# Patient Record
Sex: Female | Born: 1955
Health system: Southern US, Community
[De-identification: ages and names within clinical notes are randomized; demographics above are authoritative.]

## PROBLEM LIST (undated history)

## (undated) DIAGNOSIS — R112 Nausea with vomiting, unspecified: Secondary | ICD-10-CM

## (undated) DIAGNOSIS — I4891 Unspecified atrial fibrillation: Secondary | ICD-10-CM

## (undated) DIAGNOSIS — Z87898 Personal history of other specified conditions: Secondary | ICD-10-CM

## (undated) DIAGNOSIS — IMO0002 Reserved for concepts with insufficient information to code with codable children: Secondary | ICD-10-CM

## (undated) DIAGNOSIS — N200 Calculus of kidney: Secondary | ICD-10-CM

## (undated) DIAGNOSIS — M1990 Unspecified osteoarthritis, unspecified site: Secondary | ICD-10-CM

## (undated) DIAGNOSIS — T7840XA Allergy, unspecified, initial encounter: Secondary | ICD-10-CM

## (undated) DIAGNOSIS — Z9889 Other specified postprocedural states: Secondary | ICD-10-CM

## (undated) DIAGNOSIS — K529 Noninfective gastroenteritis and colitis, unspecified: Secondary | ICD-10-CM

## (undated) DIAGNOSIS — R42 Dizziness and giddiness: Secondary | ICD-10-CM

## (undated) HISTORY — PX: SHOULDER SURGERY: SHX246

## (undated) HISTORY — DX: Personal history of other specified conditions: Z87.898

## (undated) HISTORY — PX: COLONOSCOPY: SHX174

## (undated) HISTORY — DX: Unspecified osteoarthritis, unspecified site: M19.90

## (undated) HISTORY — DX: Nausea with vomiting, unspecified: R11.2

## (undated) HISTORY — DX: Dizziness and giddiness: R42

## (undated) HISTORY — DX: Calculus of kidney: N20.0

## (undated) HISTORY — DX: Other specified postprocedural states: Z98.890

## (undated) HISTORY — DX: Unspecified atrial fibrillation: I48.91

## (undated) HISTORY — DX: Noninfective gastroenteritis and colitis, unspecified: K52.9

## (undated) HISTORY — DX: Allergy, unspecified, initial encounter: T78.40XA

## (undated) HISTORY — DX: Reserved for concepts with insufficient information to code with codable children: IMO0002

---

## 1980-03-14 HISTORY — PX: GYNECOLOGIC CRYOSURGERY: SHX857

## 1989-03-14 HISTORY — PX: VAGINAL HYSTERECTOMY: SUR661

## 1999-12-24 ENCOUNTER — Encounter (INDEPENDENT_AMBULATORY_CARE_PROVIDER_SITE_OTHER): Payer: Self-pay

## 1999-12-24 ENCOUNTER — Other Ambulatory Visit: Admission: RE | Admit: 1999-12-24 | Discharge: 1999-12-24 | Payer: Self-pay | Admitting: Obstetrics and Gynecology

## 2002-01-01 ENCOUNTER — Other Ambulatory Visit: Admission: RE | Admit: 2002-01-01 | Discharge: 2002-01-01 | Payer: Self-pay | Admitting: Obstetrics and Gynecology

## 2002-03-14 HISTORY — PX: BACK SURGERY: SHX140

## 2002-04-24 ENCOUNTER — Observation Stay (HOSPITAL_COMMUNITY): Admission: RE | Admit: 2002-04-24 | Discharge: 2002-04-25 | Payer: Self-pay | Admitting: Specialist

## 2002-04-24 ENCOUNTER — Encounter: Payer: Self-pay | Admitting: Specialist

## 2002-04-25 ENCOUNTER — Encounter (INDEPENDENT_AMBULATORY_CARE_PROVIDER_SITE_OTHER): Payer: Self-pay | Admitting: Specialist

## 2003-02-10 ENCOUNTER — Other Ambulatory Visit: Admission: RE | Admit: 2003-02-10 | Discharge: 2003-02-10 | Payer: Self-pay | Admitting: Obstetrics and Gynecology

## 2003-08-13 ENCOUNTER — Emergency Department (HOSPITAL_COMMUNITY): Admission: EM | Admit: 2003-08-13 | Discharge: 2003-08-13 | Payer: Self-pay | Admitting: Emergency Medicine

## 2004-02-18 ENCOUNTER — Other Ambulatory Visit: Admission: RE | Admit: 2004-02-18 | Discharge: 2004-02-18 | Payer: Self-pay | Admitting: Obstetrics and Gynecology

## 2004-05-24 ENCOUNTER — Ambulatory Visit: Payer: Self-pay | Admitting: Pulmonary Disease

## 2005-02-18 ENCOUNTER — Other Ambulatory Visit: Admission: RE | Admit: 2005-02-18 | Discharge: 2005-02-18 | Payer: Self-pay | Admitting: Obstetrics and Gynecology

## 2006-02-21 ENCOUNTER — Other Ambulatory Visit: Admission: RE | Admit: 2006-02-21 | Discharge: 2006-02-21 | Payer: Self-pay | Admitting: Obstetrics and Gynecology

## 2006-08-18 ENCOUNTER — Ambulatory Visit: Payer: Self-pay | Admitting: Pulmonary Disease

## 2006-08-18 LAB — CONVERTED CEMR LAB
ALT: 22 units/L (ref 0–40)
AST: 21 units/L (ref 0–37)
Basophils Relative: 1 % (ref 0.0–1.0)
Bilirubin, Direct: 0.1 mg/dL (ref 0.0–0.3)
CO2: 30 meq/L (ref 19–32)
Calcium: 9.8 mg/dL (ref 8.4–10.5)
Chloride: 101 meq/L (ref 96–112)
Creatinine, Ser: 0.7 mg/dL (ref 0.4–1.2)
Eosinophils Absolute: 0.1 10*3/uL (ref 0.0–0.6)
Eosinophils Relative: 1.5 % (ref 0.0–5.0)
GFR calc non Af Amer: 94 mL/min
Glucose, Bld: 96 mg/dL (ref 70–99)
Platelets: 242 10*3/uL (ref 150–400)
RBC: 4.3 M/uL (ref 3.87–5.11)
RDW: 12.1 % (ref 11.5–14.6)
Total Bilirubin: 0.8 mg/dL (ref 0.3–1.2)
WBC: 5.1 10*3/uL (ref 4.5–10.5)

## 2006-08-21 ENCOUNTER — Ambulatory Visit: Payer: Self-pay | Admitting: Internal Medicine

## 2006-08-24 ENCOUNTER — Ambulatory Visit: Payer: Self-pay | Admitting: Gastroenterology

## 2006-08-25 ENCOUNTER — Ambulatory Visit: Payer: Self-pay | Admitting: Gastroenterology

## 2006-08-26 ENCOUNTER — Emergency Department (HOSPITAL_COMMUNITY): Admission: EM | Admit: 2006-08-26 | Discharge: 2006-08-27 | Payer: Self-pay | Admitting: Emergency Medicine

## 2007-02-19 ENCOUNTER — Telehealth: Payer: Self-pay | Admitting: Pulmonary Disease

## 2007-03-30 ENCOUNTER — Other Ambulatory Visit: Admission: RE | Admit: 2007-03-30 | Discharge: 2007-03-30 | Payer: Self-pay | Admitting: Obstetrics and Gynecology

## 2007-05-01 ENCOUNTER — Ambulatory Visit: Payer: Self-pay | Admitting: Pulmonary Disease

## 2007-05-01 DIAGNOSIS — Z87442 Personal history of urinary calculi: Secondary | ICD-10-CM

## 2007-05-01 DIAGNOSIS — R519 Headache, unspecified: Secondary | ICD-10-CM | POA: Insufficient documentation

## 2007-05-01 DIAGNOSIS — R51 Headache: Secondary | ICD-10-CM

## 2007-05-01 DIAGNOSIS — K5289 Other specified noninfective gastroenteritis and colitis: Secondary | ICD-10-CM

## 2007-05-01 DIAGNOSIS — IMO0002 Reserved for concepts with insufficient information to code with codable children: Secondary | ICD-10-CM

## 2007-05-01 DIAGNOSIS — R42 Dizziness and giddiness: Secondary | ICD-10-CM | POA: Insufficient documentation

## 2007-05-01 HISTORY — DX: Personal history of urinary calculi: Z87.442

## 2007-07-05 ENCOUNTER — Encounter: Payer: Self-pay | Admitting: Pulmonary Disease

## 2007-07-28 DIAGNOSIS — K589 Irritable bowel syndrome without diarrhea: Secondary | ICD-10-CM | POA: Insufficient documentation

## 2007-07-28 DIAGNOSIS — Z8719 Personal history of other diseases of the digestive system: Secondary | ICD-10-CM

## 2008-04-03 ENCOUNTER — Ambulatory Visit: Payer: Self-pay | Admitting: Obstetrics and Gynecology

## 2008-04-03 ENCOUNTER — Encounter: Payer: Self-pay | Admitting: Obstetrics and Gynecology

## 2008-04-03 ENCOUNTER — Other Ambulatory Visit: Admission: RE | Admit: 2008-04-03 | Discharge: 2008-04-03 | Payer: Self-pay | Admitting: Obstetrics and Gynecology

## 2009-01-26 ENCOUNTER — Encounter: Payer: Self-pay | Admitting: Pulmonary Disease

## 2009-04-06 ENCOUNTER — Other Ambulatory Visit: Admission: RE | Admit: 2009-04-06 | Discharge: 2009-04-06 | Payer: Self-pay | Admitting: Obstetrics and Gynecology

## 2009-04-06 ENCOUNTER — Ambulatory Visit: Payer: Self-pay | Admitting: Obstetrics and Gynecology

## 2009-04-09 ENCOUNTER — Encounter: Admission: RE | Admit: 2009-04-09 | Discharge: 2009-04-09 | Payer: Self-pay | Admitting: Specialist

## 2009-04-10 ENCOUNTER — Ambulatory Visit (HOSPITAL_BASED_OUTPATIENT_CLINIC_OR_DEPARTMENT_OTHER): Admission: RE | Admit: 2009-04-10 | Discharge: 2009-04-10 | Payer: Self-pay | Admitting: Specialist

## 2009-06-17 ENCOUNTER — Ambulatory Visit (HOSPITAL_BASED_OUTPATIENT_CLINIC_OR_DEPARTMENT_OTHER): Admission: RE | Admit: 2009-06-17 | Discharge: 2009-06-17 | Payer: Self-pay | Admitting: Specialist

## 2009-09-16 ENCOUNTER — Ambulatory Visit: Payer: Self-pay | Admitting: Obstetrics and Gynecology

## 2010-04-08 ENCOUNTER — Other Ambulatory Visit: Payer: Self-pay | Admitting: Obstetrics and Gynecology

## 2010-04-08 ENCOUNTER — Ambulatory Visit
Admission: RE | Admit: 2010-04-08 | Discharge: 2010-04-08 | Payer: Self-pay | Source: Home / Self Care | Attending: Obstetrics and Gynecology | Admitting: Obstetrics and Gynecology

## 2010-04-08 ENCOUNTER — Other Ambulatory Visit (HOSPITAL_COMMUNITY)
Admission: RE | Admit: 2010-04-08 | Discharge: 2010-04-08 | Disposition: A | Discharge status: Home / Self Care | Payer: Self-pay | Source: Ambulatory Visit | Attending: Obstetrics and Gynecology | Admitting: Obstetrics and Gynecology

## 2010-04-08 DIAGNOSIS — Z124 Encounter for screening for malignant neoplasm of cervix: Secondary | ICD-10-CM | POA: Insufficient documentation

## 2010-05-30 LAB — CBC
HCT: 37.9 % (ref 36.0–46.0)
Hemoglobin: 12.6 g/dL (ref 12.0–15.0)
MCHC: 33.1 g/dL (ref 30.0–36.0)
RDW: 12.8 % (ref 11.5–15.5)

## 2010-05-30 LAB — BASIC METABOLIC PANEL
CO2: 29 mEq/L (ref 19–32)
Calcium: 8.9 mg/dL (ref 8.4–10.5)
Glucose, Bld: 100 mg/dL — ABNORMAL HIGH (ref 70–99)
Sodium: 140 mEq/L (ref 135–145)

## 2010-06-02 LAB — BASIC METABOLIC PANEL
BUN: 16 mg/dL (ref 6–23)
Calcium: 8.8 mg/dL (ref 8.4–10.5)
GFR calc non Af Amer: >60 mL/min (ref >60)
Glucose, Bld: 92 mg/dL (ref 70–99)
Sodium: 137 mEq/L (ref 135–145)

## 2010-06-02 LAB — CBC
Platelets: 247 10*3/uL (ref 150–400)
RDW: 12.4 % (ref 11.5–15.5)

## 2010-07-27 NOTE — Assessment & Plan Note (Signed)
HEALTHCARE                         GASTROENTEROLOGY OFFICE NOTE   NAME:Shirley, Carroll                       MRN:          [id number]  DATE:08/24/2006                            DOB:          1955/07/26    Shirley Carroll is a 55 year old white female referred by Dr. Kriste Basque for  evaluation of abdominal pain.   Shirley Carroll went on a cruise on May 17th, and had no problems.  Exactly 1  week ago she developed dull aching discomfort in her suprapubic area,  which has advanced around her flanks in to her back.  This is dull  steady pain that is worse as the day goes on, and is associated with  some diarrhea.  She has had 2 CT scans of the abdomen, which have been  unremarkable, with and without contrast.  Lab data has been normal with  a sed rate of 16, and a normal CBC and metabolic profile.   She denies any other infectious disease exposure.  There are no sick  family members at home.  She does use Aleve on a p.r.n. basis, but does  not use heavy doses.  She does not smoke or abuse ethanol.  She has had  no associated fever, chills, skin rashes, joint pains, or other systemic  complaints.  Because of the extent of her pain, she has trouble sleeping  at night.  She has never had previous abdominal or pelvic surgery.  As  part of her workup, she saw Dr. Darvin Neighbours, and apparently had a negative  cystoscopy.  She denies gynecologic problems.   Patient does have a vague past history of colitis when she saw Dr.  Juanda Chance some 22 years ago.  These records are not available for review.  Patient does have 3 to 4 loose bowel movements a day, but denies rectal  bleeding or nocturnal diarrhea.   PAST MEDICAL HISTORY:  Otherwise, noncontributory, except for recurrent  kidney stones.  She does have a herniated disk, and had back surgery in  2003, and a hysterectomy in 1991.   FAMILY HISTORY:  Noncontributory in terms of gastrointestinal problems.  She has 3 aunts that have  had breast cancer.   SOCIAL HISTORY:  She is an Manufacturing engineer, and has a high school  education.  She is married and lives with her husband.  She does not  abuse alcohol, cigarettes, or illicit drugs.   MEDICATIONS:  1. Anaspaz 125 mcg 4 times a day.  2. Cipro, which was prescribed, but she has not taken.   SHE DOES HAVE A HISTORY OF ALLERGIC REACTION TO DARVOCET.   EXAMINATION:  Shows her to be a healthy, attractive appearing white  female in no distress, appearing her stated age.  I cannot appreciate stigmata of chronic liver disease.  She weighs 163 pounds.  Blood pressure is 98/68.  Pulse was 64 and  regular.  CHEST:  Was clear.  She appeared to be in a regular rhythm without murmurs, gallops, or  rubs.  I could not appreciate any abdominal distention, organomegaly, or  significant tenderness to palpation.  Peripheral extremities  were unremarkable.  Inspection of the rectum was unremarkable.  Rectal exam showed loose  stool, which was trace guaiac positive.   ASSESSMENT:  Shirley Carroll gives a history of previous colitis.  I would  wonder if currently if she does have infectious colitis versus  inflammatory bowel disease.   RECOMMENDATIONS:  I have asked her to bring in a stool culture for exam  as soon as possible.  We will go ahead and proceed with colonoscopy  within the next 24 hours.  I have given her some Tylenol 3 to use along  with her Anaspaz in the interim.     Vania Rea. Jarold Motto, MD, Caleen Essex, FAGA  Electronically Signed    DRP/MedQ  DD: 08/24/2006  DT: 08/24/2006  Job #: [id number]   cc:   Lonzo Cloud. Kriste Basque, MD  Rande Brunt Eda Paschal, M.D.  Ronald L. Earlene Plater, M.D.

## 2010-07-30 NOTE — H&P (Signed)
NAMEMarland Kitchen  Shirley Carroll, Shirley Carroll NO.:  [id number]   MEDICAL RECORD NO.:  [id number]                    PATIENT TYPE:   LOCATION:                                       FACILITY:   PHYSICIAN:  Jene Every, M.D.                 DATE OF BIRTH:  June 24, 1955   DATE OF ADMISSION:  04/24/2002  DATE OF DISCHARGE:                                HISTORY & PHYSICAL   CHIEF COMPLAINT:  Right lower extremity pain.   HISTORY OF PRESENT ILLNESS:  The patient is a 55 year old female who first  noted low back pain in September 2003, while lifting tree stumps.  She  states this gradually subsided, but she re-aggravated it in December by  putting up and taking down Christmas decorations.  Since that time the  patient states her low back pain and the pain into her right lower extremity  has gotten progressively worse where at this point it is debilitating in  nature.  The patient was first seen by Dr. Shelle Iron last week in the clinic  where an MRI was ordered and showed right posterolateral disk herniation at  L5-S1 with a free fragment compressing the right S1 nerve root.  Due to the  patient's symptomatology and results of her MRI, it was felt that she would  benefit from a microdiskectomy at L5-S1.  The risks and the benefits of the  procedure were discussed with the patient, and she wishes to proceed.   PAST MEDICAL HISTORY:  None.   PAST SURGICAL HISTORY:  1. Hysterectomy in 1991.  2. C-section.   CURRENT MEDICATIONS:  Vicodin p.r.n.  Robaxin p.r.n.  Phenergan p.r.n.  Sterapred Dosepak.   ALLERGIES:  DARVON, which causes nausea and vomiting.   SOCIAL HISTORY:  The patient is married.  She denies any tobacco use.  She  does admit to occasional alcohol intake.  Her husband will be her caregiver  following surgery.  She lives in a one-story home with no stairs.   FAMILY HISTORY:  Significant for mother who has breast cancer.   REVIEW OF SYSTEMS:  GENERAL:  The patient  denies fevers, chills, night  sweats, or bleeding tendencies.  CNS:  No blurred or double vision, seizure,  headache, or paralysis.  RESPIRATORY:  No shortness of breath, productive  cough, or hemoptysis.  CARDIOVASCULAR:  No chest pain, angina, or orthopnea.  GASTROINTESTINAL:  Positive nausea and constipation.  No vomiting or  diarrhea.  GENITOURINARY:  No dysuria, hematuria, or discharge.   PHYSICAL EXAMINATION:  VITAL SIGNS:  Pulse is 88, respirations 16, BP  100/80.  GENERAL:  The patient is in extreme pain.  She does walk with an antalgic  gait.  HEENT:  Atraumatic, normocephalic.  Pupils equal and reactive to light.  NECK:  Supple.  No lymphadenopathy.  CHEST:  Clear to auscultation bilaterally.  No rhonchi, wheezes, or rales.  BREASTS:  Not examined, not pertinent to HPI.  HEART:  There is a slight mitral click noted.  There has been diagnosed by  Dr. Kriste Basque in the past.  Otherwise regular rate and rhythm without gallops or  rubs.  ABDOMEN:  Soft, nontender, nondistended.  Bowel sounds x 4.  No  hepatosplenomegaly.  GENITOURINARY:  Not examined, not pertinent to HPI.  SKIN:  No rashes or lesions are noted.  EXTREMITIES:  The patient does have positive straight leg raise on the right  which reproduces buttocks and thigh pain.   LABORATORY DATA:  MRI of the lumbar spine shows right posterolateral disk  herniation L5-S1 with a free fragment compressing the right S1 nerve root.   IMPRESSION:  Herniated nucleus pulposus L5-S1 on the right.   PLAN:  The patient will be admitted to [facility] to undergo  microdiskectomy at L5-S1 on April 24, 2002.     Roma Schanz, P.A.                   Jene Every, M.D.    CS/MEDQ  D:  04/23/2002  T:  04/23/2002  Job:  [id number]

## 2010-07-30 NOTE — Op Note (Signed)
NAME:  Shirley Carroll, Shirley Carroll                          ACCOUNT NO.:  [id number]   MEDICAL RECORD NO.:  [id number]                   PATIENT TYPE:  AMB   LOCATION:  DAY                                  FACILITY:  [facility]   PHYSICIAN:  Jene Every, M.D.                 DATE OF BIRTH:  1955-05-01   DATE OF PROCEDURE:  04/24/2002  DATE OF DISCHARGE:                                 OPERATIVE REPORT   PREOPERATIVE DIAGNOSES:  Herniated nucleus pulposus, spinal stenosis L5-S1  right.   POSTOPERATIVE DIAGNOSES:  Herniated nucleus pulposus, spinal stenosis L5-S1  right.   PROCEDURE:  Lateral recess decompression L5-S1, foraminotomy at L5 and S1,  microdiskectomy L5-S1.   ANESTHESIA:  General.   ASSISTANT:  Kerrin Champagne, M.D.   BRIEF HISTORY:  This is a 55 year old with refractory right lower extremity  radicular pain S1 nerve root distribution, __________ diminished sensation  in the S1 dermatome. Diminished repetitive plantar flexion. Operative  intervention was indicated for decompression of the L5 and S1 nerve roots by  marked diskectomy. Confirmatory MRI showing an extruded fragment at L5-S1.  The risks and benefits were discussed including bleeding, infection, damage  to neurovascular structures, CSF leakage, epidural fibrosis, adjacent  segment disease, etc.   TECHNIQUE:  The patient in supine position and after an adequate level of  general anesthesia and 1 gm of Kefzol, she was placed prone on the [place]  frame, all bony prominences well padded. The lumbar region was prepped and  draped in the usual sterile fashion. An 18 gauge spinal needle was utilized  to localize the L5-S1 interspace to confirm the x-ray. An incision was made  from the spinous process of 5 to S1. The subcutaneous tissue was dissected,  electrocautery was utilized to achieve hemostasis. The dorsolumbar fascia  was identified and divided in line with the skin incision. The paraspinous  muscle was elevated from  the lamina of 5 and S1. A McCullough retractor was  placed. The operating microscope was draped and brought onto the surgical  field. A second to confirm and third radiograph was obtained. The ligamentum  flavum was detached from the cephalad edge of S1 and the caudad edge of 5.  Hemilaminotomy was performed with a 2 and a 3 mm Kerrison raising the  leaflet of the ligamentum flavum. The significant lateral recess stenosis  noted secondary to her accentuated lumbosacral angle and facet hypertrophy.  The lateral recess was decompressed with a 2 and 3 mm Kerrison. The S1 nerve  root identified after excellent foraminotomy and was found to be compressed  in the lateral recess. It was gently mobilized medially and a nerve hook was  utilized to retrieve two large fragments of disk material which were then  removed with the pituitary. Following this, the nerve root was free. It was  found to be erythematous and edematous. We used a hockey stick probe  beneath  the thecal sac, examined the axilla and the S1 and 5 foramen and there was  no residual fragment noted. The disk space was fairly well collapsed at this  point. We were unable to enter the disk space with the pituitary. There was  extensive venous plexus and epidural. Bipolar electrocautery was utilized to  achieve hemostasis. The wound was copiously irrigated with antibiotic  irrigation and again the free mobility S1 nerve root.   No active bleeding. Bipolar electrocautery was utilized to achieve  hemostasis. I placed thrombin soaked Gelfoam in the laminotomy defect for a  period of time. This was removed and again strict hemostasis was performed.  I placed 1 cc of Fentanyl into the wound around the nerve root. The  McCullough retractor was removed, paraspinous muscle inspected with no  evidence of active bleeding. The dorsal lumbar fascia was reapproximated  with #1 Vicryl interrupted figure-of-eight sutures. The subcutaneous tissue  was  reapproximated with 2-0 Vicryl simple sutures. The skin was  reapproximated with 4-0 subcuticular Prolene. A sterile dressing was  applied. The patient was placed supine on the hospital bed, extubated  without difficulty and transported to the recovery room in satisfactory  condition.   The patient tolerated the procedure well with no complications.                                               Jene Every, M.D.    Cordelia Pen  D:  04/24/2002  T:  04/24/2002  Job:  [id number]

## 2010-07-30 NOTE — Assessment & Plan Note (Signed)
[facility] HEALTHCARE                                 ON-CALL NOTE   NAME:[name], Shirley Carroll                         MRN:          [id number]  DATE:05/12/2009                            DOB:          03/15/1955    PRIMARY CARE [facility] Notaro:  Dr. Lonzo Cloud. Kriste Basque, MD   The patient's husband called at approximately 2015 hours claiming that  his wife had an acute illness, stomach cramps, chills, and fever.  The  cramps were relieved with emesis.  He was curious as to any flu viruses  being  noted in the area.  I told him that there was a flu epidemic  previously and if his wife did not respond to the typical viral course  they should report to the emergency room for further evaluation.  Particularly, if she has any continued abdominal pain or any persistence  of severe fevers or chills.  He seemed to acknowledge the advise and  will follow up in the ER if necessary.     Rogelia Rohrer, MD     Clemetine Marker  DD: 05/12/2009  DT: 05/13/2009  Job #: [id number]

## 2010-12-16 IMAGING — CR DG CHEST 2V
2 series · 2 of 2 positions shown · non-contrast
Comparison: 08/13/2003

CLINICAL DATA: Preop for shoulder surgery

CHEST - 2 VIEW

[w chest pa]
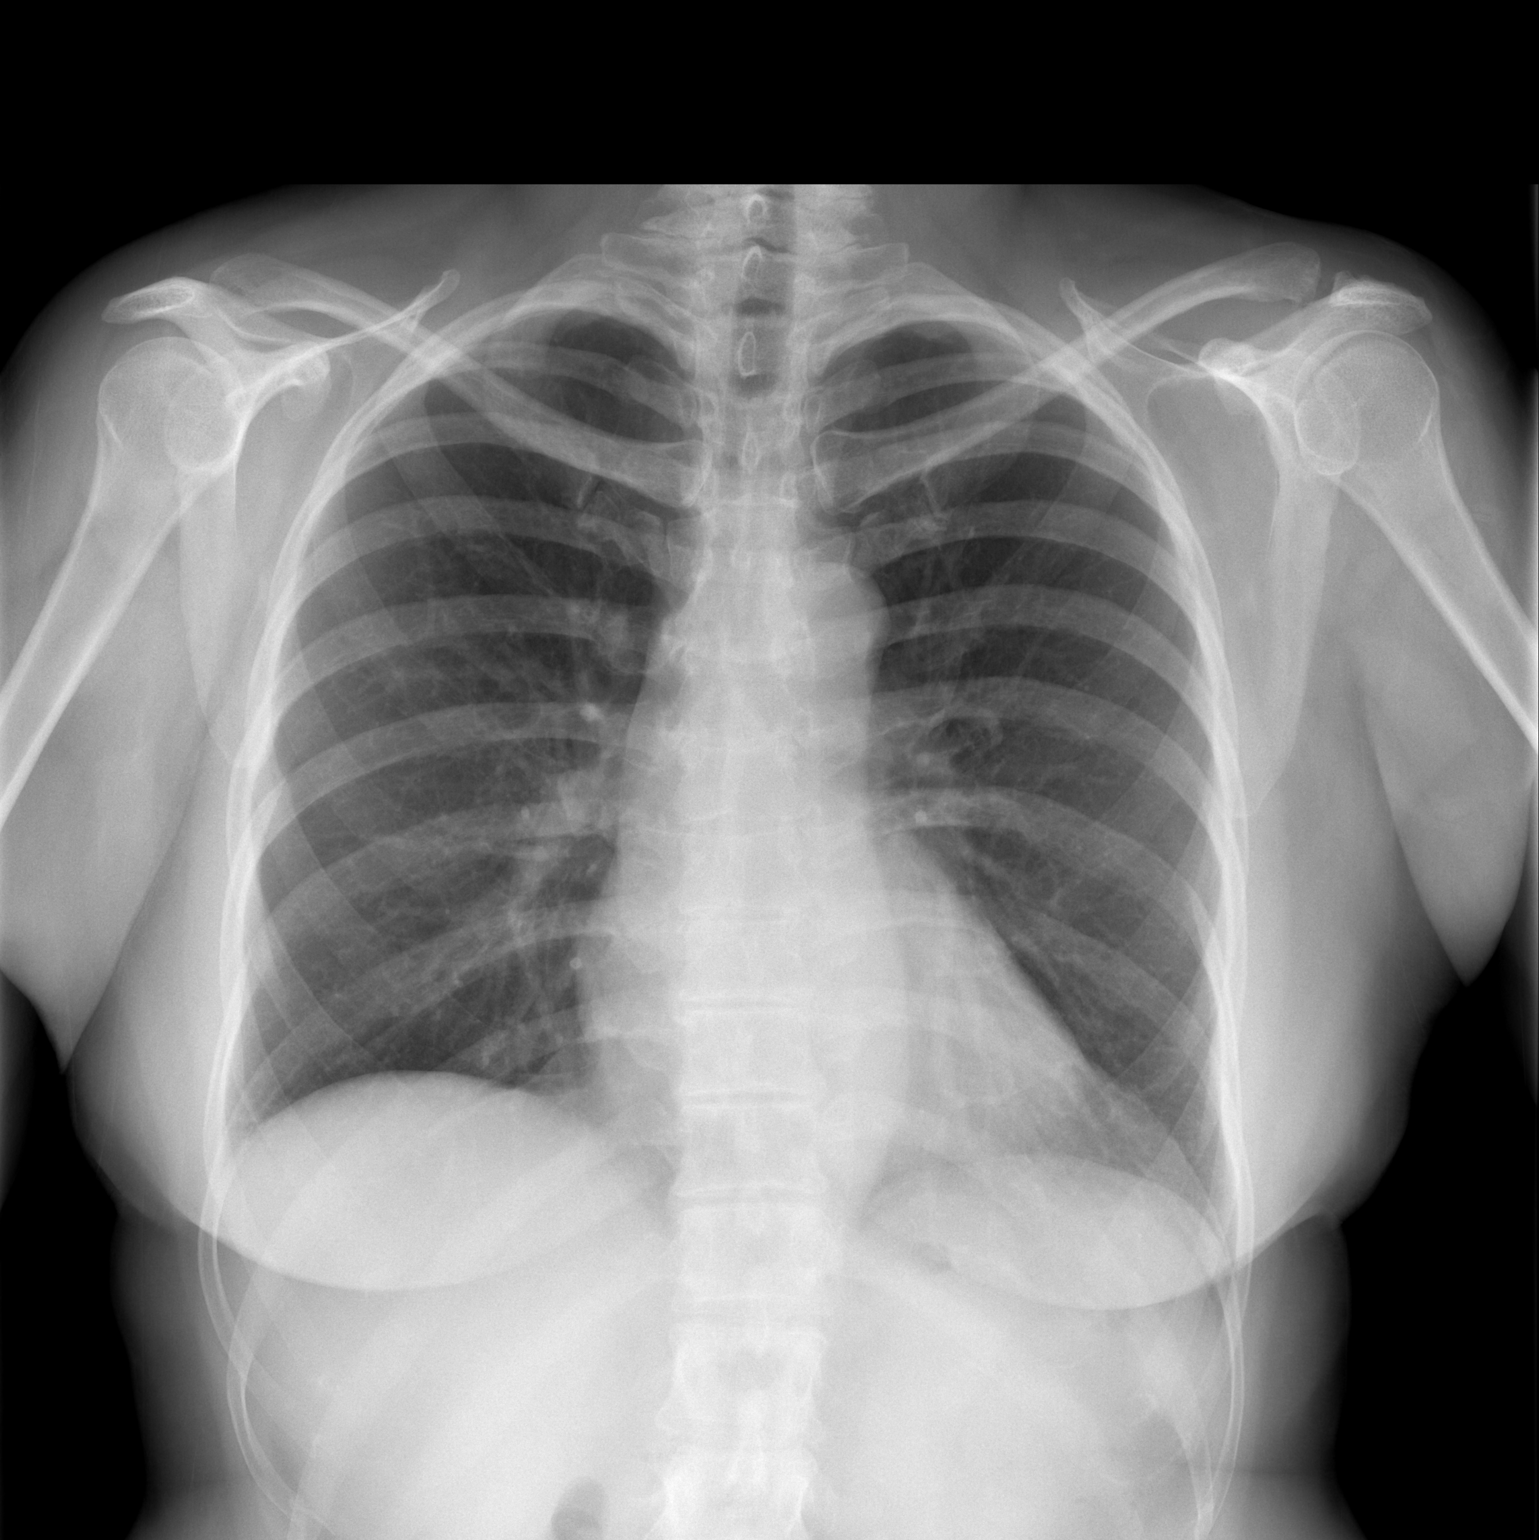

[w chest lat]
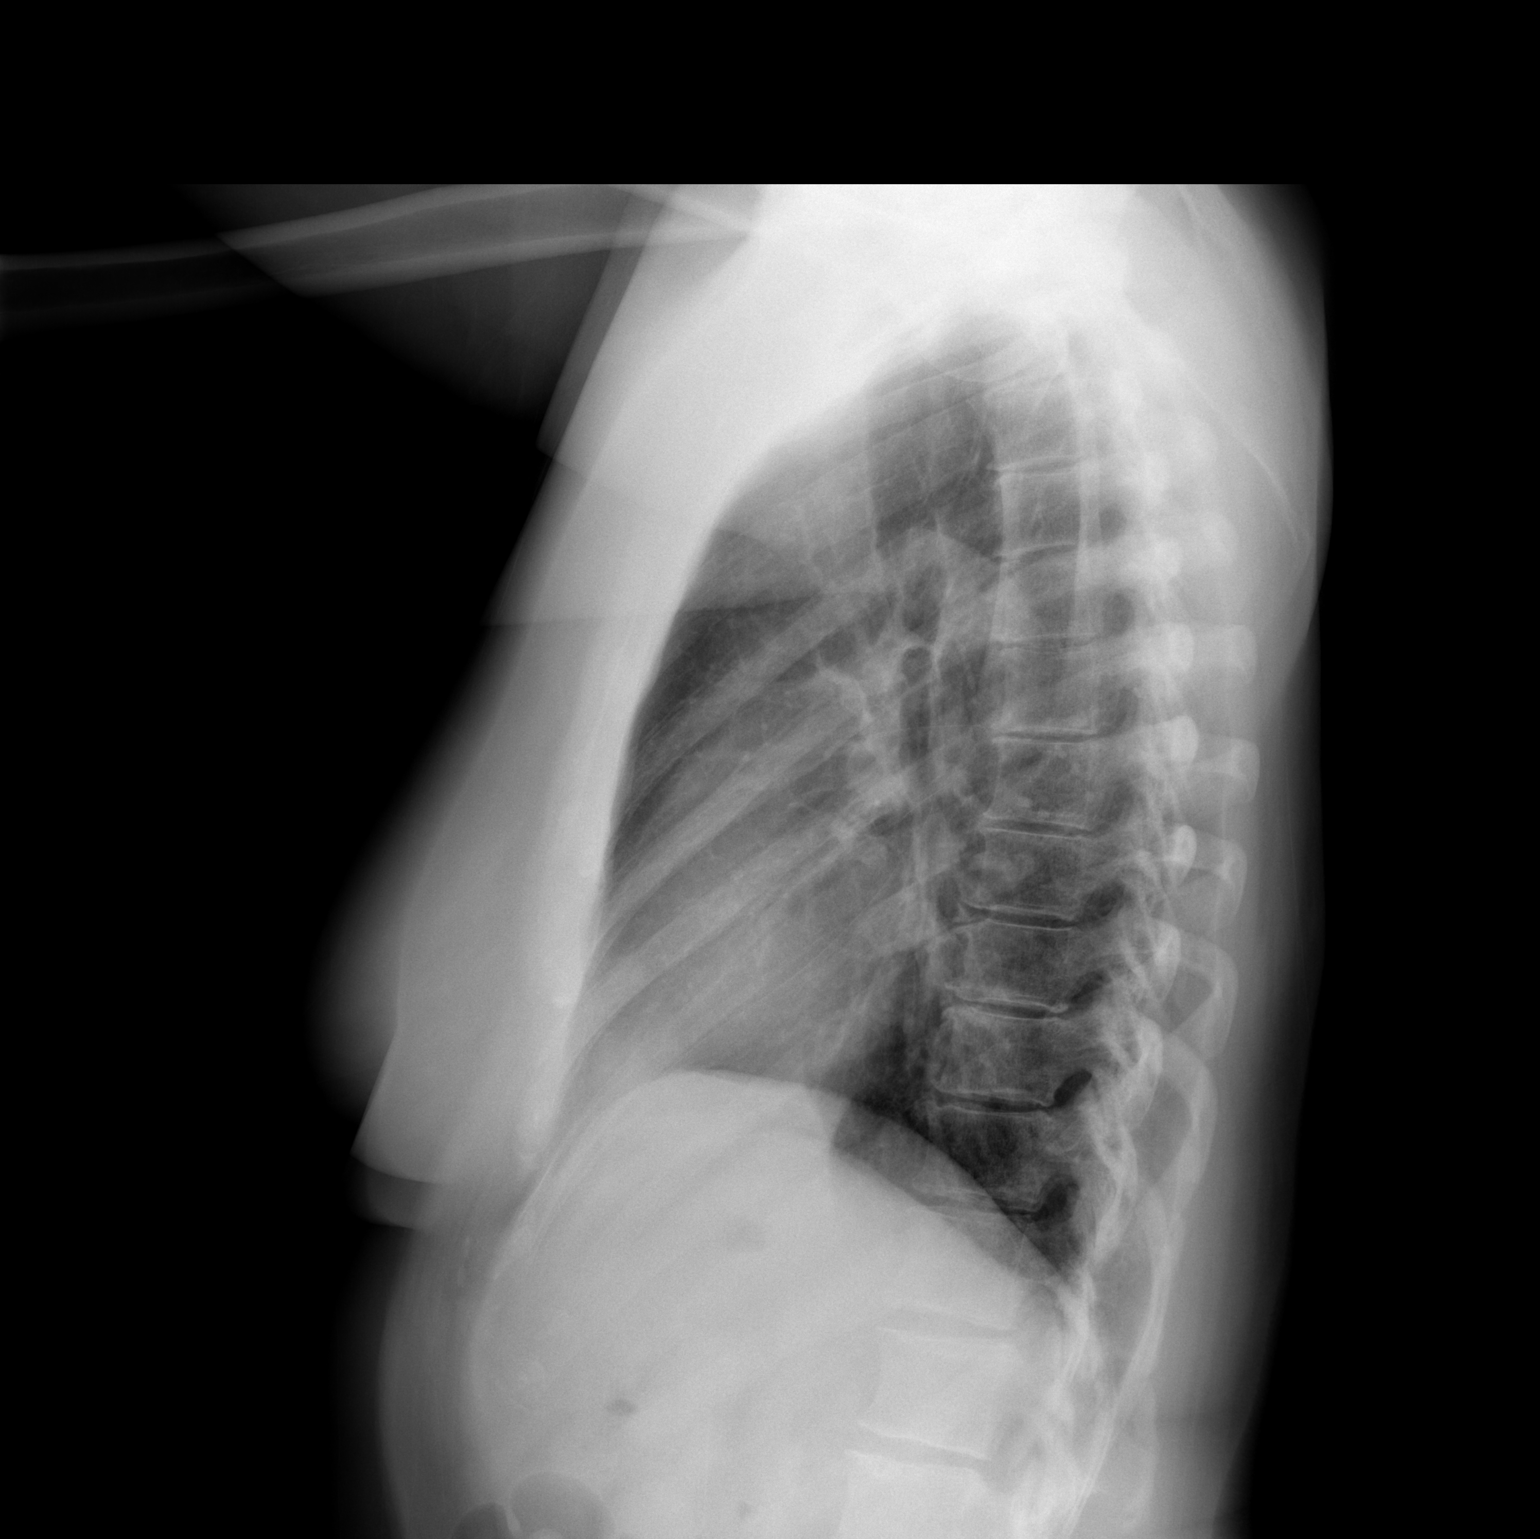

[2 of 2 positions shown; findings below may reference images not displayed]

FINDINGS: Heart and mediastinal contours normal except for a mildly
elongated aorta.  Lungs clear.  Osseous structures intact.
IMPRESSION: No active disease.

## 2010-12-30 LAB — DIFFERENTIAL
Basophils Absolute: 0
Lymphocytes Relative: 31
Monocytes Absolute: 0.5
Neutro Abs: 4.4
Neutrophils Relative %: 60

## 2010-12-30 LAB — I-STAT 8, (EC8 V) (CONVERTED LAB)
Acid-base deficit: 2
Chloride: 107
HCT: 44
Hemoglobin: 15
Potassium: 3.4 — ABNORMAL LOW
Sodium: 136
pH, Ven: 7.548 — ABNORMAL HIGH

## 2010-12-30 LAB — COMPREHENSIVE METABOLIC PANEL
BUN: 10
CO2: 20
Calcium: 10.1
Creatinine, Ser: 0.8
GFR calc non Af Amer: >60
Glucose, Bld: 97
Sodium: 137
Total Protein: 7.8

## 2010-12-30 LAB — CBC
Hemoglobin: 14.4
RBC: 4.6
RDW: 12.9

## 2010-12-30 LAB — URINALYSIS, ROUTINE W REFLEX MICROSCOPIC
Nitrite: NEGATIVE
Specific Gravity, Urine: 1.012
Urobilinogen, UA: 0.2

## 2010-12-30 LAB — URINE CULTURE

## 2010-12-30 LAB — URINE MICROSCOPIC-ADD ON

## 2010-12-30 LAB — POCT I-STAT CREATININE: Operator id: 151321

## 2010-12-30 LAB — LIPASE, BLOOD: Lipase: 30

## 2011-02-23 IMAGING — CR DG SHOULDER 2+V PORT*R*
1 series · 1 of 1 positions shown · non-contrast
Comparison: None

CLINICAL DATA: Right shoulder pain.  Adhesive capsulitis.

PORTABLE RIGHT SHOULDER - 2+ VIEW

[view not recorded]
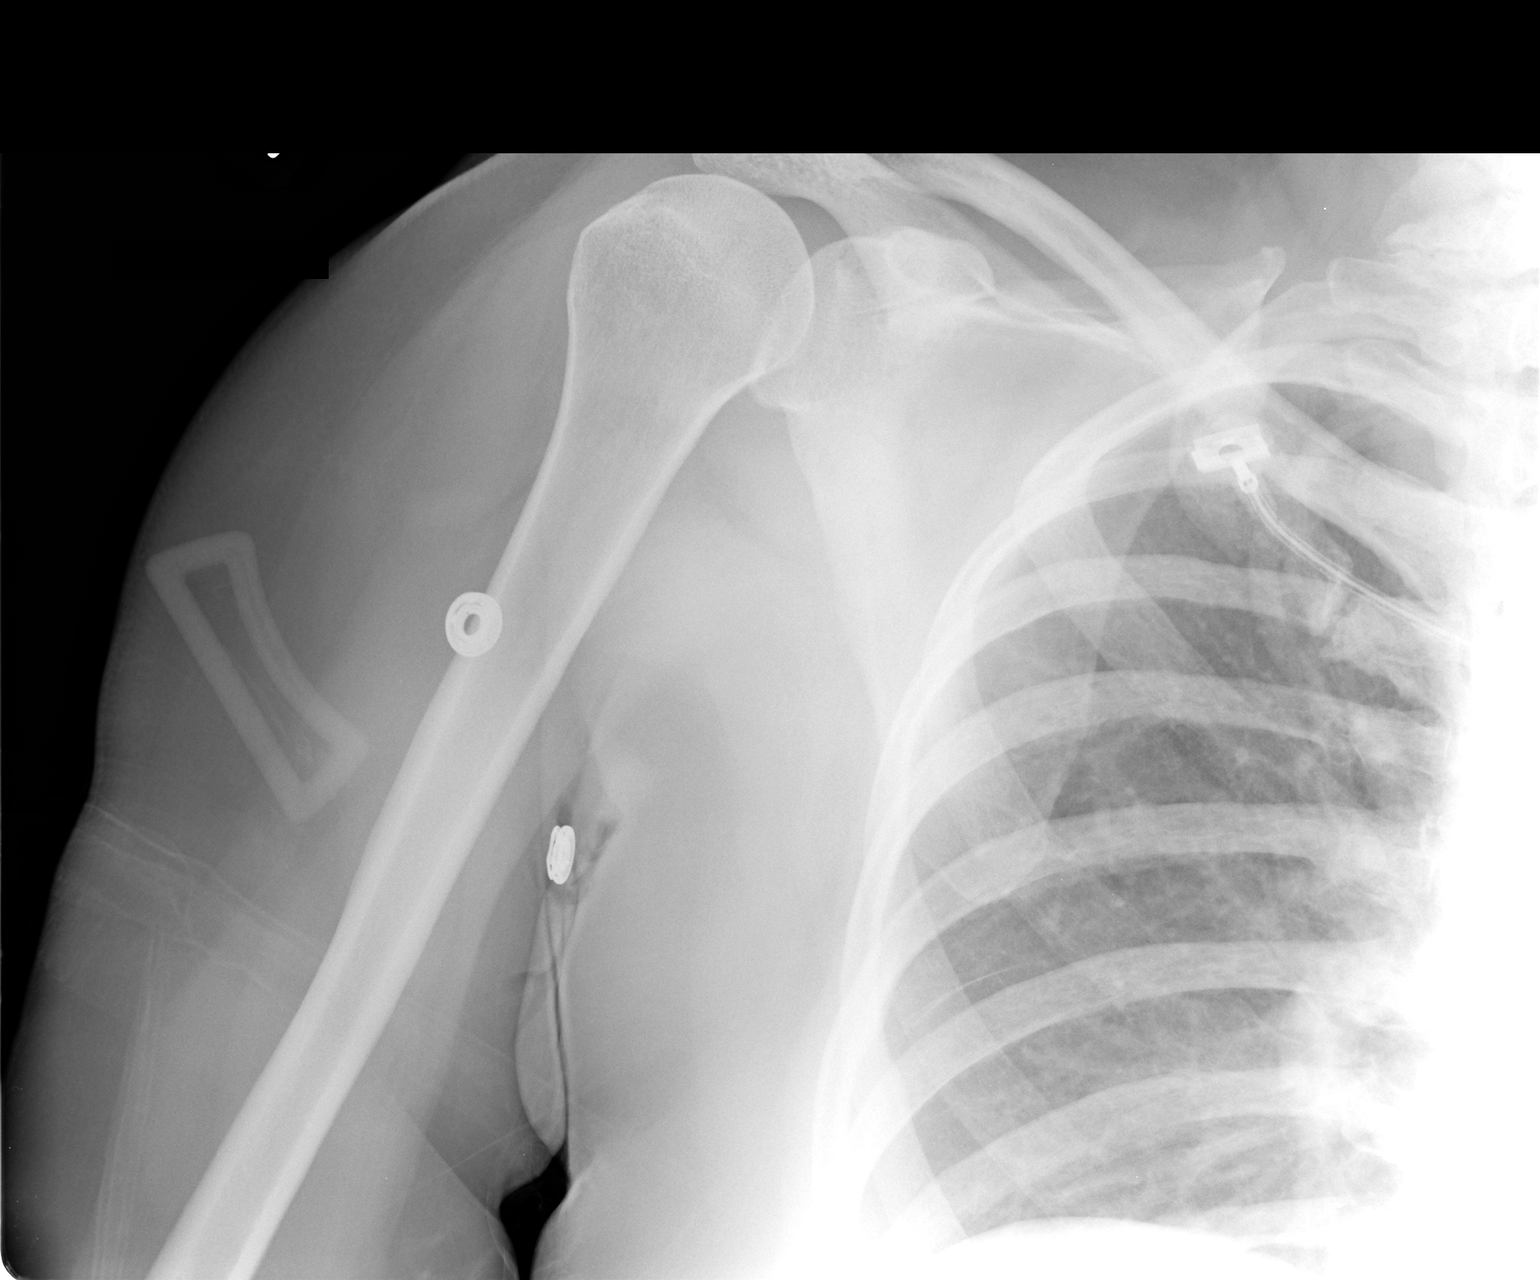

[1 of 1 positions shown; findings below may reference images not displayed]

FINDINGS: The joint spaces are maintained.  No fractures are seen.
IMPRESSION: No acute bony findings.

## 2011-04-01 DIAGNOSIS — M1990 Unspecified osteoarthritis, unspecified site: Secondary | ICD-10-CM | POA: Insufficient documentation

## 2011-04-01 DIAGNOSIS — IMO0001 Reserved for inherently not codable concepts without codable children: Secondary | ICD-10-CM | POA: Insufficient documentation

## 2011-04-27 ENCOUNTER — Ambulatory Visit (INDEPENDENT_AMBULATORY_CARE_PROVIDER_SITE_OTHER): Payer: BC Managed Care – PPO | Admitting: Obstetrics and Gynecology

## 2011-04-27 ENCOUNTER — Other Ambulatory Visit: Payer: Self-pay | Admitting: Obstetrics and Gynecology

## 2011-04-27 ENCOUNTER — Other Ambulatory Visit (HOSPITAL_COMMUNITY)
Admission: RE | Admit: 2011-04-27 | Discharge: 2011-04-27 | Disposition: A | Discharge status: Home / Self Care | Payer: BC Managed Care – PPO | Source: Ambulatory Visit | Attending: Obstetrics and Gynecology | Admitting: Obstetrics and Gynecology

## 2011-04-27 ENCOUNTER — Encounter: Payer: Self-pay | Admitting: Obstetrics and Gynecology

## 2011-04-27 VITALS — BP 124/76 | Ht 65.0 in | Wt 165.0 lb

## 2011-04-27 DIAGNOSIS — Z01419 Encounter for gynecological examination (general) (routine) without abnormal findings: Secondary | ICD-10-CM

## 2011-04-27 LAB — URINALYSIS W MICROSCOPIC + REFLEX CULTURE
Casts: NONE SEEN
Crystals: NONE SEEN
Nitrite: NEGATIVE
Specific Gravity, Urine: 1.02 (ref 1.005–1.030)
Urobilinogen, UA: 0.2 mg/dL (ref 0.0–1.0)
pH: 7 (ref 5.0–8.0)

## 2011-04-27 MED ORDER — ESTRADIOL 1 MG PO TABS: 1.0000 mg | ORAL_TABLET | Freq: Every day | ORAL | Status: DC

## 2011-04-27 NOTE — Progress Notes (Signed)
Patient came to see me today for her annual GYN exam.she remains on oral estradiol with good results of her menopausal symptoms. She is having no vaginal dryness. She is up-to-date on mammograms. She had her cholesterol checked at work and it was normal. She is sending me copies of her results. She is having no vaginal bleeding. She is having no pelvic pain. She has had 2 normal bone densities.  HEENT: Within normal limits. [place] present Neck: No masses. Supraclavicular lymph nodes: Not enlarged. Breasts: Examined in both sitting and lying position. Symmetrical without skin changes or masses. Abdomen: Soft no masses guarding or rebound. No hernias. Pelvic: External within normal limits. BUS within normal limits. Vaginal examination shows good estrogen effect, no cystocele enterocele or rectocele. Cervix and uterus absent. Adnexa within normal limits. Rectovaginal confirmatory. Extremities within normal limits.  Assessment: Menopausal symptoms  Plan: Continue estradiol 1 mg daily. Continue yearly mammograms.

## 2011-04-28 ENCOUNTER — Other Ambulatory Visit: Payer: Self-pay | Admitting: Obstetrics and Gynecology

## 2011-04-28 LAB — CBC WITH DIFFERENTIAL/PLATELET
Basophils Absolute: 0.1 10*3/uL (ref 0.0–0.1)
Eosinophils Relative: 2 % (ref 0–5)
HCT: 38.8 % (ref 36.0–46.0)
Hemoglobin: 13 g/dL (ref 12.0–15.0)
Lymphocytes Relative: 33 % (ref 12–46)
MCHC: 33.5 g/dL (ref 30.0–36.0)
MCV: 92.4 fL (ref 78.0–100.0)
Monocytes Absolute: 0.7 10*3/uL (ref 0.1–1.0)
Monocytes Relative: 10 % (ref 3–12)
Neutro Abs: 3.9 10*3/uL (ref 1.7–7.7)
RDW: 12.5 % (ref 11.5–15.5)

## 2011-04-29 LAB — URINE CULTURE

## 2011-05-25 ENCOUNTER — Encounter: Payer: Self-pay | Admitting: Obstetrics and Gynecology

## 2011-11-07 ENCOUNTER — Ambulatory Visit (INDEPENDENT_AMBULATORY_CARE_PROVIDER_SITE_OTHER): Payer: BC Managed Care – PPO | Admitting: Adult Health

## 2011-11-07 ENCOUNTER — Ambulatory Visit: Payer: BC Managed Care – PPO | Admitting: Obstetrics and Gynecology

## 2011-11-07 ENCOUNTER — Telehealth: Payer: Self-pay | Admitting: Pulmonary Disease

## 2011-11-07 ENCOUNTER — Encounter: Payer: Self-pay | Admitting: *Deleted

## 2011-11-07 VITALS — BP 110/78 | HR 71 | Temp 97.5°F | Ht 64.0 in | Wt 163.0 lb

## 2011-11-07 DIAGNOSIS — B029 Zoster without complications: Secondary | ICD-10-CM

## 2011-11-07 HISTORY — DX: Zoster without complications: B02.9

## 2011-11-07 MED ORDER — PREDNISONE 10 MG PO TABS: ORAL_TABLET | ORAL | Status: DC

## 2011-11-07 MED ORDER — VALACYCLOVIR HCL 1 G PO TABS: 1000.0000 mg | ORAL_TABLET | Freq: Three times a day (TID) | ORAL | Status: DC

## 2011-11-07 NOTE — Telephone Encounter (Signed)
Per SN---we are booked this week---if the pt thinks that this is shingles then she should be seen at [facility] for this.  Called and spoke with pt and she was very agitated that we were not able to get her an appt with SN.  She requested that she be seen by someone else in this practice and the pt stated that she has been coming here for 30 years and could not believe that we were treating her like this.  Pt is aware of appt today with TP at 3;30.  Nothing further is needed.

## 2011-11-07 NOTE — Patient Instructions (Addendum)
Valtrex 1 gram Three times a day  For 7 days  Prednisone taper over next week.  Once rash is crusted over may use Zostrix cream otc As needed  For pain  Please contact office for sooner follow up if symptoms do not improve or worsen or seek emergency care  Follow up Dr. Nadel  In 4-6 weeks for physical.    

## 2011-11-07 NOTE — Progress Notes (Signed)
  Subjective:    Patient ID: Shirley Carroll, female    DOB: Feb 12, 1956, 56 y.o.   MRN: [id number]  HPI 56 yo WF with known hx of DDD and IBS   11/07/2011 Acute OV  Last seen in office 2009, not seen by Dr. Kriste Basque  Her PCP in >4.5 yrs .  Presents today for an acute work in for rash  Complains of painful rash on right side and right side of abd x 2 days  Says area is very sensitive to touch, burning pain   No recent travel or new meds  No chest pain, fever, or edema.   Since last seen in office, Says she has been doing well, follows up with GYN for routine care.  Discussed importance of physical exams and routine fasting labs.  last colon > 2008 nml exam  Last mammogram >05/3011 neg    Review of Systems Constitutional:   No  weight loss, night sweats,  Fevers, chills, fatigue, or  lassitude.  HEENT:   No headaches,  Difficulty swallowing,  Tooth/dental problems, or  Sore throat,                No sneezing, itching, ear ache, nasal congestion, post nasal drip,   CV:  No chest pain,  Orthopnea, PND, swelling in lower extremities, anasarca, dizziness, palpitations, syncope.   GI  No heartburn, indigestion, abdominal pain, nausea, vomiting, diarrhea, change in bowel habits, loss of appetite, bloody stools.   Resp: No shortness of breath with exertion or at rest.  No excess mucus, no productive cough,  No non-productive cough,  No coughing up of blood.  No change in color of mucus.  No wheezing.  No chest wall deformity  Skin: +painful  rash   GU: no dysuria, change in color of urine, no urgency or frequency.  No flank pain, no hematuria   MS:  No joint pain or swelling.  No decreased range of motion.  No back pain.  Psych:  No change in mood or affect. No depression or anxiety.  No memory loss.          Objective:   Physical Exam GEN: A/Ox3; pleasant , NAD, well nourished   HEENT:  [place]/AT,  EACs-clear, TMs-wnl, NOSE-clear, THROAT-clear, no lesions, no postnasal drip or exudate  noted.   NECK:  Supple w/ fair ROM; no JVD; normal carotid impulses w/o bruits; no thyromegaly or nodules palpated; no lymphadenopathy.  RESP  Clear  P & A; w/o, wheezes/ rales/ or rhonchi.no accessory muscle use, no dullness to percussion  CARD:  RRR, no m/r/g  , no peripheral edema, pulses intact, no cyanosis or clubbing.  GI:   Soft & nt; nml bowel sounds; no organomegaly or masses detected.  Musco: Warm bil, no deformities or joint swelling noted.   Neuro: alert, no focal deficits noted.    Skin: Warm, along waistline on -right lower abdomen that radiates to back -grouped plaque of pustules with pink/red base          Assessment & Plan:

## 2011-11-07 NOTE — Telephone Encounter (Signed)
Pt reports that she has a rash on right side of waist and pain at area.  Pain started before rash appeared.  Pt denies itching of drainage at site of rash.  Pt went down to see her nurse at work and she thinks this may be shingles.  Pt has not seen Dr Kriste Basque since 2009 because she states that she has not been sick and has only been seeing her GYN.  Please advise.

## 2011-11-07 NOTE — Assessment & Plan Note (Signed)
Valtrex 1 gram Three times a day  For 7 days  Prednisone taper over next week.  Once rash is crusted over may use Zostrix cream otc As needed  For pain  Please contact office for sooner follow up if symptoms do not improve or worsen or seek emergency care  Follow up Dr. Kriste Basque  In 4-6 weeks for physical.

## 2011-11-11 ENCOUNTER — Telehealth: Payer: Self-pay | Admitting: Adult Health

## 2011-11-11 NOTE — Telephone Encounter (Signed)
Spoke with pt and advised okay to be around other people, but stay away from small children and pregnant women. She verbalized understanding and states nothing further needed.

## 2011-11-18 ENCOUNTER — Telehealth: Payer: Self-pay | Admitting: Pulmonary Disease

## 2011-11-18 MED ORDER — HYDROCODONE-ACETAMINOPHEN 5-325 MG PO TABS: 1.0000 | ORAL_TABLET | Freq: Three times a day (TID) | ORAL | Status: AC | PRN

## 2011-11-18 NOTE — Telephone Encounter (Signed)
Pt has shingles and has been using the extra strength tylenol and is this is not helping.  Wanting recs on what she can use.  SN please advise. Thanks  Allergies  Allergen Reactions  . Promethazine Hcl   . Propoxyphene Hcl     REACTION: unspecified

## 2011-11-18 NOTE — Telephone Encounter (Signed)
Per SN---ok to call in norco 5/325 mg  #90  1 po tid prn pain with 5 refills for this pt.  Called and lmom at the home number and the cell number to make pt aware that this rx has been called to the pharmacy   [facility] on [address].  Will sign off of this message.

## 2011-11-21 ENCOUNTER — Telehealth: Payer: Self-pay | Admitting: Pulmonary Disease

## 2011-11-21 NOTE — Telephone Encounter (Signed)
Per TP LAST ov NOTE:  Patient Instructions     Valtrex 1 gram Three times a day For 7 days  Prednisone taper over next week.  Once rash is crusted over may use Zostrix cream otc As needed For pain  Please contact office for sooner follow up if symptoms do not improve or worsen or seek emergency care  Follow up Dr. Kriste Basque In 4-6 weeks for physical   I advised her of the cream that TP stated she could use once the rash is crusted over. She voiced her understanding and needed nothing further

## 2011-12-08 ENCOUNTER — Ambulatory Visit: Payer: BC Managed Care – PPO | Admitting: Adult Health

## 2011-12-13 ENCOUNTER — Ambulatory Visit (INDEPENDENT_AMBULATORY_CARE_PROVIDER_SITE_OTHER): Payer: BC Managed Care – PPO | Admitting: Pulmonary Disease

## 2011-12-13 ENCOUNTER — Encounter: Payer: Self-pay | Admitting: Pulmonary Disease

## 2011-12-13 VITALS — BP 112/74 | HR 72 | Temp 98.3°F | Ht 64.0 in | Wt 163.4 lb

## 2011-12-13 DIAGNOSIS — R51 Headache: Secondary | ICD-10-CM

## 2011-12-13 DIAGNOSIS — IMO0002 Reserved for concepts with insufficient information to code with codable children: Secondary | ICD-10-CM

## 2011-12-13 DIAGNOSIS — Z23 Encounter for immunization: Secondary | ICD-10-CM

## 2011-12-13 DIAGNOSIS — Z87442 Personal history of urinary calculi: Secondary | ICD-10-CM

## 2011-12-13 DIAGNOSIS — B029 Zoster without complications: Secondary | ICD-10-CM

## 2011-12-13 DIAGNOSIS — K589 Irritable bowel syndrome without diarrhea: Secondary | ICD-10-CM

## 2011-12-13 NOTE — Patient Instructions (Addendum)
Today we updated your med list in our EPIC system...    Continue your current medications the same...  Today we gave you the combination Tetanus vaccine called the TDAP, it is good for 10 yrs...  It was great seeing you again...    Please let us know if we can be of service in any way.Marland KitchenMarland Kitchen

## 2011-12-13 NOTE — Progress Notes (Signed)
Subjective:     Patient ID: Shirley Carroll, female   DOB: 1955/10/07, 56 y.o.   MRN: [id number]  HPI 12 y/o WF, wife of Shirley Carroll, and last seen by me prior to 2007 when we last used paper charts (old record not avail today & she was not seen on [facility] & not seen prev on [facility] EMR)...  ~  December 13, 2011:  Here to re-establish care, but Shirley Carroll indicates that she has been doing well w/o signif interval medical history, until 8/13 when she developed right T11-12 Shingles outbreak;  She was seen by TP & treated w/ Valtrex, Pred, Zostrix cream w/ slow steady improvement & mod pain that has finally resolved...     "I think I'm good, just here for f/u Shingles" and she refuses CPX, CXR, EKG, Fasting blood work...    I updated problem list based on her hx & avail records >> OK TDAP today; she will get Flu vaccine next wk at work...   PROBLEM LIST:    Hx IBS >> followed for GI by Shirley Carroll, prev on Librax but hasn't meeded meds in yrs... ~  CT Abd & Pelvis in 2008 was WNL, neg... ~  Colonoscopy 2008 was similarly WNL, f/u planned 10 yrs...  S/P Hysterectomy 1991 >> on ESTRADIOL 1mg  daily... ~  She is followed for GYN by Shirley Carroll- regular PAP smears, Mammograms, & BMDs;  His Epic note 2/13 indicates that she is doing well, up to date on Mammography, and had 2 normal bone density tests  Hx Kidney Stones >> this occurred yrs ago w/o recurrence...  OSTEOARTHRITIS, Right SHOULDER Pain, Hx HNP/ LBP >>  ~  S/P Back Surg 2004 by DrBeane w/ L5-S1 microdiskectomy, foraminotomy L5, & Lateral recess decompression L5-S1;  She is doing satis & denies LBP at present... ~  S/P Right Shoulder Surg 2011 by Shirley Carroll for Impingement syndrome & adhesive capsulitis w/ shoulder arthroscopy for debridement, acromioplasty, & bursectomy;   She reports doing satis, good ROM, no recurrent shoulder pain...  MIGRAINE HEADACHES >> remote hx migraines vs mixed HAs, but she rarely gets HAs now... ~  2013:  She  reports one recent HA (occipital) w/ blurry vision, n/v, and resolved spont w/ OTC meds; she will observe for any recurrence...  HEALTH MAINTENANCE:   ~  GI:  Followed by Shirley Carroll and up to date w/ f/u colon due 2018... ~  GYN:  Followed by Shirley Carroll w/ PAP smears, Mammography, & BMDs all deemed up to date... ~  Immuniz:  She gets the yearly Flu vaccine at work;  Given TDAP 10/13;  She will need Pneumovax at age 48;  Rx written for Shingles vaccine...   Past Medical History  Diagnosis Date  . Dysplasia   . Osteoarthritis   . Vertigo   . Herniated disc   . Renal calculus   . History of headache   . Colitis     Past Surgical History  Procedure Date  . Vaginal hysterectomy 1991  . Back surgery 2004  . Shoulder surgery   . Colposcopy   . Gynecologic cryosurgery     Outpatient Encounter Prescriptions as of 12/13/2011  Medication Sig Dispense Refill  . estradiol (ESTRACE) 1 MG tablet Take 1 tablet (1 mg total) by mouth daily.  30 tablet  12  . ibuprofen (ADVIL,MOTRIN) 200 MG tablet Take 200 mg by mouth every 6 (six) hours as needed.      Marland Kitchen DISCONTD: predniSONE (DELTASONE) 10 MG tablet 4 tabs  for 2 days, then 3 tabs for 2 days, 2 tabs for 2 days, then 1 tab for 2 days, then stop  20 tablet  0  . DISCONTD: valACYclovir (VALTREX) 1000 MG tablet Take 1 tablet (1,000 mg total) by mouth 3 (three) times daily.  21 tablet  0    Allergies  Allergen Reactions  . Promethazine Hcl   . Propoxyphene Hcl     REACTION: unspecified    Current Medications, Allergies, Past Medical History, Past Surgical History, Family History, and Social History were reviewed in [employer] record.   Review of Systems    Constitutional:  Denies F/C/S, anorexia, unexpected weight change. HEENT:  No HA, visual changes, earache, nasal symptoms, sore throat, hoarseness. Resp:  No cough, sputum, hemoptysis; no SOB, tightness, wheezing. Cardio:  No CP, palpit, DOE, orthopnea,  edema. GI:  Denies N/V/D/C or blood in stool; no reflux, abd pain, distention, or gas. GU:  No dysuria, freq, urgency, hematuria, or flank pain. MS:  Denies joint pain, swelling, tenderness, or decr ROM; no neck pain, back pain, etc. Neuro:  No tremors, seizures, dizziness, syncope, weakness, numbness, gait abn. Skin:  No suspicious lesions or skin rash. Heme:  No adenopathy, bruising, bleeding. Psyche: Denies confusion, sleep disturbance, hallucinations, anxiety, depression.   Objective:   Physical Exam    Vital Signs:  Reviewed...  General:  WD, WN,    y/o WM in NAD; alert & oriented; pleasant & cooperative... HEENT:  [place]/AT; Conjunctiva- pink, Sclera- nonicteric, EOM-wnl, PERRLA, Fundi-benign; EACs-clear, TMs-wnl; NOSE-clear; THROAT-clear & wnl. Neck:  Supple w/ full ROM; no JVD; normal carotid impulses w/o bruits; no thyromegaly or nodules palpated; no lymphadenopathy. Chest:  Clear to P & A; without wheezes, rales, or rhonchi heard. Heart:  Regular Rhythm; norm S1 & S2 without murmurs, rubs, or gallops detected. Abdomen:  Soft & nontender- no guarding or rebound; normal bowel sounds; no organomegaly or masses palpated. Ext:  Normal ROM; without deformities or arthritic changes; no varicose veins, venous insuffic, or edema;  Pulses intact w/o bruits. Neuro:  CNs II-XII intact; motor testing normal; sensory testing normal; gait normal & balance OK. Derm:  No lesions noted; no rash etc. Lymph:  No cervical, supraclavicular, axillary, or inguinal adenopathy palpated.  RADIOLOGY DATA:  Reviewed in the EPIC EMR & discussed w/ the patient...  LABORATORY DATA:  Reviewed in the EPIC EMR & discussed w/ the patient...   Assessment:     Hx Shingles>  S/p right T11-12 shingles, now gone, no scarring seen, not tender; rec to get the Shingles vaccine...  Hx IBS>  She denies recent prob w/ abd pain, d/c/blood/ etc;  No meds required;  Last colonoscopy was 6/08 by Shirley Carroll & f/u due  2018...  S/P Hysterectomy>  Followed for GYN by Shirley Carroll on Estradiol 1mg /d...  Hx Kidney Stones>  Remote hx, no recurrence...  DJD>  She is s/p back & right shoulder surg in past; she denies any current problems or pain...  Headaches>  prob mixed HAs in past & onlt one recent prob but she doesn't want meds...     Plan:     Patient's Medications  New Prescriptions   No medications on file  Previous Medications   ESTRADIOL (ESTRACE) 1 MG TABLET    Take 1 tablet (1 mg total) by mouth daily.   IBUPROFEN (ADVIL,MOTRIN) 200 MG TABLET    Take 200 mg by mouth every 6 (six) hours as needed.  Modified Medications   No medications  on file  Discontinued Medications   PREDNISONE (DELTASONE) 10 MG TABLET    4 tabs for 2 days, then 3 tabs for 2 days, 2 tabs for 2 days, then 1 tab for 2 days, then stop   VALACYCLOVIR (VALTREX) 1000 MG TABLET    Take 1 tablet (1,000 mg total) by mouth 3 (three) times daily.

## 2012-05-08 ENCOUNTER — Other Ambulatory Visit: Payer: Self-pay | Admitting: Obstetrics and Gynecology

## 2012-06-12 ENCOUNTER — Encounter: Payer: Self-pay | Admitting: Gynecology

## 2012-06-24 ENCOUNTER — Other Ambulatory Visit: Payer: Self-pay | Admitting: Gynecology

## 2012-07-23 ENCOUNTER — Other Ambulatory Visit: Payer: Self-pay | Admitting: Gynecology

## 2012-08-15 ENCOUNTER — Ambulatory Visit (INDEPENDENT_AMBULATORY_CARE_PROVIDER_SITE_OTHER): Payer: BC Managed Care – PPO | Admitting: Gynecology

## 2012-08-15 ENCOUNTER — Encounter: Payer: Self-pay | Admitting: Gynecology

## 2012-08-15 VITALS — BP 110/72 | Ht 63.5 in | Wt 167.0 lb

## 2012-08-15 DIAGNOSIS — Z01419 Encounter for gynecological examination (general) (routine) without abnormal findings: Secondary | ICD-10-CM

## 2012-08-15 DIAGNOSIS — Z13220 Encounter for screening for lipoid disorders: Secondary | ICD-10-CM

## 2012-08-15 DIAGNOSIS — Z79890 Hormone replacement therapy (postmenopausal): Secondary | ICD-10-CM

## 2012-08-15 LAB — COMPREHENSIVE METABOLIC PANEL
ALT: 9 U/L (ref 0–35)
AST: 12 U/L (ref 0–37)
CO2: 28 mEq/L (ref 19–32)
Sodium: 141 mEq/L (ref 135–145)
Total Bilirubin: 0.3 mg/dL (ref 0.3–1.2)
Total Protein: 6.3 g/dL (ref 6.0–8.3)

## 2012-08-15 LAB — CBC WITH DIFFERENTIAL/PLATELET
Eosinophils Absolute: 0.1 10*3/uL (ref 0.0–0.7)
Lymphocytes Relative: 30 % (ref 12–46)
Lymphs Abs: 1.7 10*3/uL (ref 0.7–4.0)
MCH: 30.3 pg (ref 26.0–34.0)
Neutrophils Relative %: 59 % (ref 43–77)
Platelets: 268 10*3/uL (ref 150–400)
RBC: 4.03 MIL/uL (ref 3.87–5.11)
WBC: 5.6 10*3/uL (ref 4.0–10.5)

## 2012-08-15 LAB — LIPID PANEL
Cholesterol: 162 mg/dL (ref 0–200)
LDL Cholesterol: 88 mg/dL (ref 0–99)
VLDL: 21 mg/dL (ref 0–40)

## 2012-08-15 MED ORDER — ESTRADIOL 2 MG PO TABS: ORAL_TABLET | ORAL | Status: DC

## 2012-08-15 NOTE — Progress Notes (Signed)
ITZA MANIACI Dec 15, 1955 [id number]        57 y.o.  G2P1011 for annual exam.  Former patient of Dr. Eda Paschal. Several issues noted below.  Past medical history,surgical history, medications, allergies, family history and social history were all reviewed and documented in the EPIC chart.  ROS:  Performed and pertinent positives and negatives are included in the history, assessment and plan .  Exam: Sherrilyn Rist assistant Filed Vitals:   08/15/12 1458  BP: 110/72  Height: 5' 3.5" (1.613 m)  Weight: 167 lb (75.751 kg)   General appearance  Normal Skin grossly normal Head/Neck normal with no cervical or supraclavicular adenopathy thyroid normal Lungs  clear Cardiac RR, without RMG Abdominal  soft, nontender, without masses, organomegaly or hernia Breasts  examined lying and sitting without masses, retractions, discharge or axillary adenopathy. Pelvic  Ext/BUS/vagina  normal with mild atrophic changes  Adnexa  Without masses or tenderness    Anus and perineum  normal   Rectovaginal  normal sphincter tone without palpated masses or tenderness.    Assessment/Plan:  57 y.o. G4P1011 female for annual exam, status post TVH.   1. Pap smear 2013. No Pap smear done today. Status post TVH for carcinoma in situ of the cervix 1991. Pap smears have been normal since then. Over 20 years since her hysterectomy. Options to stop screening altogether versus less frequent screening intervals reviewed and we'll rediscuss on an annual basis. 2. HRT. Patient is on Estrace 1 mg daily for menopausal symptoms of hot flushes and night sweats. She actually ran out several weeks ago and had return of her symptoms. I reviewed the whole HRT issue with her in the WHI study to include increased risk of stroke heart attack DVT. The ACOG and NAMS statements for lowest dose for shortest period of time reviewed. Transdermal advantage versus oral/first pass effect. After lengthy discussion the patient wants to continue as she is  doing well on her current medication and I refilled her time to year. 3. DEXA 09/2009 normal. Recommend repeat at five-year interval. Increase calcium and vitamin D reviewed. 4. Colonoscopy 2008. Repeat at their recommended interval. 5. Health maintenance. Patient does see Dr. Alroy Dust but has not had blood work in some time. CBC comprehensive metabolic panel lipid profile urinalysis vitamin D TSH ordered. Followup one year, sooner as needed.    Dara Lords MD, 3:32 PM 08/15/2012

## 2012-08-15 NOTE — Patient Instructions (Signed)
Followup in one year for annual exam. Sooner if any issues. 

## 2012-08-16 ENCOUNTER — Encounter: Payer: Self-pay | Admitting: Obstetrics and Gynecology

## 2012-08-16 LAB — URINALYSIS W MICROSCOPIC + REFLEX CULTURE
Bacteria, UA: NONE SEEN
Bilirubin Urine: NEGATIVE
Crystals: NONE SEEN
Glucose, UA: NEGATIVE mg/dL
Ketones, ur: NEGATIVE mg/dL
Protein, ur: NEGATIVE mg/dL
Urobilinogen, UA: 0.2 mg/dL (ref 0.0–1.0)

## 2012-08-16 LAB — TSH: TSH: 1.817 u[IU]/mL (ref 0.350–4.500)

## 2012-08-25 ENCOUNTER — Other Ambulatory Visit: Payer: Self-pay | Admitting: Gynecology

## 2013-06-24 ENCOUNTER — Encounter: Payer: Self-pay | Admitting: Gynecology

## 2013-08-24 ENCOUNTER — Other Ambulatory Visit: Payer: Self-pay | Admitting: Gynecology

## 2013-08-28 ENCOUNTER — Other Ambulatory Visit: Payer: Self-pay | Admitting: Gynecology

## 2013-11-14 ENCOUNTER — Other Ambulatory Visit: Payer: Self-pay | Admitting: Gynecology

## 2013-11-21 ENCOUNTER — Ambulatory Visit (INDEPENDENT_AMBULATORY_CARE_PROVIDER_SITE_OTHER): Payer: BC Managed Care – PPO | Admitting: Gynecology

## 2013-11-21 ENCOUNTER — Other Ambulatory Visit (HOSPITAL_COMMUNITY)
Admission: RE | Admit: 2013-11-21 | Discharge: 2013-11-21 | Disposition: A | Discharge status: Home / Self Care | Payer: BC Managed Care – PPO | Source: Ambulatory Visit | Attending: Gynecology | Admitting: Gynecology

## 2013-11-21 ENCOUNTER — Encounter: Payer: Self-pay | Admitting: Gynecology

## 2013-11-21 VITALS — BP 120/74 | Ht 63.5 in | Wt 157.2 lb

## 2013-11-21 DIAGNOSIS — Z79890 Hormone replacement therapy (postmenopausal): Secondary | ICD-10-CM

## 2013-11-21 DIAGNOSIS — Z01419 Encounter for gynecological examination (general) (routine) without abnormal findings: Secondary | ICD-10-CM | POA: Diagnosis not present

## 2013-11-21 LAB — CBC WITH DIFFERENTIAL/PLATELET
Basophils Absolute: 0.1 10*3/uL (ref 0.0–0.1)
Basophils Relative: 1 % (ref 0–1)
EOS PCT: 2 % (ref 0–5)
Eosinophils Absolute: 0.1 10*3/uL (ref 0.0–0.7)
HEMATOCRIT: 37.9 % (ref 36.0–46.0)
Hemoglobin: 13.1 g/dL (ref 12.0–15.0)
LYMPHS ABS: 1.9 10*3/uL (ref 0.7–4.0)
LYMPHS PCT: 33 % (ref 12–46)
MCH: 31.1 pg (ref 26.0–34.0)
MCHC: 34.6 g/dL (ref 30.0–36.0)
MCV: 90 fL (ref 78.0–100.0)
MONO ABS: 0.5 10*3/uL (ref 0.1–1.0)
Monocytes Relative: 8 % (ref 3–12)
Neutro Abs: 3.3 10*3/uL (ref 1.7–7.7)
Neutrophils Relative %: 56 % (ref 43–77)
Platelets: 283 10*3/uL (ref 150–400)
RBC: 4.21 MIL/uL (ref 3.87–5.11)
RDW: 13.2 % (ref 11.5–15.5)
WBC: 5.9 10*3/uL (ref 4.0–10.5)

## 2013-11-21 NOTE — Addendum Note (Signed)
Addended by: Richardson Chiquito on: 11/21/2013 03:13 PM   Modules accepted: Orders

## 2013-11-21 NOTE — Progress Notes (Signed)
Shirley Carroll Nov 17, 1955 [id number]        58 y.o.  G2P1011 for annual exam.  Several issues that are below.  Past medical history,surgical history, problem list, medications, allergies, family history and social history were all reviewed and documented as reviewed in the EPIC chart.  ROS:  12 system ROS performed with pertinent positives and negatives included in the history, assessment and plan.   Additional significant findings :  none   Exam: Product manager Vitals:   11/21/13 1402  BP: 120/74  Height: 5' 3.5" (1.613 m)  Weight: 157 lb 3.2 oz (71.305 kg)   General appearance:  Normal affect, orientation and appearance. Skin: Grossly normal HEENT: Without gross lesions.  No cervical or supraclavicular adenopathy. Thyroid normal.  Lungs:  Clear without wheezing, rales or rhonchi Cardiac: RR, without RMG Abdominal:  Soft, nontender, without masses, guarding, rebound, organomegaly or hernia Breasts:  Examined lying and sitting without masses, retractions, discharge or axillary adenopathy. Pelvic:  Ext/BUS/vagina with mild atrophic changes. Pap of cuff done  Adnexa  Without masses or tenderness    Anus and perineum  Normal   Rectovaginal  Normal sphincter tone without palpated masses or tenderness.    Assessment/Plan:  58 y.o. G20P1011 female for annual exam.   1. Postmenopausal/HRT. Status post [facility] 1991 for CIS of the cervix. Patient had been on 1 mg estradiol daily but ran out one to 2 weeks ago. Has done well without significant hot flashes. I again reviewed the issues of HRT to include the WHI study with increased risk of stroke heart attack DVT and possible breast cancer. At this point since she is doing well she will stay off of it. If she does have her return of hot flashes and wants to reinitiate HRT she will call. If so we'll start her back at the 0.5 mg and see how she does with that. Otherwise she continues well but will plan expected management. Is not having symptoms  of vaginal dryness or dyspareunia. 2. Pap smear 2013. Pap of cuff done today given her history of CIS of the cervix.   3. Mammography 05/2013. Continued annual mammography. SBE monthly reviewed. 4. Bone density 09/2009 normal. Plan repeat DEXA next year at the 5 year interval/age 58. Increase calcium vitamin D reviewed.  Check baseline vitamin D. 5. Colonoscopy 2008. Patient reports recommended repeat interval 10 years. 6. Health maintenance. Baseline CBC, comprehensive metabolic panel lipid profile urinalysis TSH and vitamin D ordered.  Follow up one year, sooner as needed.     Note: This document was prepared with digital dictation and possible smart phrase technology. Any transcriptional errors that result from this process are unintentional.   Dara Lords MD, 2:24 PM 11/21/2013

## 2013-11-21 NOTE — Patient Instructions (Addendum)
Call if menopausal symptoms become a problem.  Try over the counter soy based products such as Estrovin to see if that does not help with menopausal symptoms.  You may obtain a copy of any labs that were done today by logging onto MyChart as outlined in the instructions provided with your AVS (after visit summary). The office will not call with normal lab results but certainly if there are any significant abnormalities then we will contact you.   Health Maintenance, Female A healthy lifestyle and preventative care can promote health and wellness.  Maintain regular health, dental, and eye exams.  Eat a healthy diet. Foods like vegetables, fruits, whole grains, low-fat dairy products, and lean protein foods contain the nutrients you need without too many calories. Decrease your intake of foods high in solid fats, added sugars, and salt. Get information about a proper diet from your caregiver, if necessary.  Regular physical exercise is one of the most important things you can do for your health. Most adults should get at least 150 minutes of moderate-intensity exercise (any activity that increases your heart rate and causes you to sweat) each week. In addition, most adults need muscle-strengthening exercises on 2 or more days a week.   Maintain a healthy weight. The body mass index (BMI) is a screening tool to identify possible weight problems. It provides an estimate of body fat based on height and weight. Your caregiver can help determine your BMI, and can help you achieve or maintain a healthy weight. For adults 20 years and older:  A BMI below 18.5 is considered underweight.  A BMI of 18.5 to 24.9 is normal.  A BMI of 25 to 29.9 is considered overweight.  A BMI of 30 and above is considered obese.  Maintain normal blood lipids and cholesterol by exercising and minimizing your intake of saturated fat. Eat a balanced diet with plenty of fruits and vegetables. Blood tests for lipids and  cholesterol should begin at age 74 and be repeated every 5 years. If your lipid or cholesterol levels are high, you are over 50, or you are a high risk for heart disease, you may need your cholesterol levels checked more frequently.Ongoing high lipid and cholesterol levels should be treated with medicines if diet and exercise are not effective.  If you smoke, find out from your caregiver how to quit. If you do not use tobacco, do not start.  Lung cancer screening is recommended for adults aged 72 80 years who are at high risk for developing lung cancer because of a history of smoking. Yearly low-dose computed tomography (CT) is recommended for people who have at least a 30-pack-year history of smoking and are a current smoker or have quit within the past 15 years. A pack year of smoking is smoking an average of 1 pack of cigarettes a day for 1 year (for example: 1 pack a day for 30 years or 2 packs a day for 15 years). Yearly screening should continue until the smoker has stopped smoking for at least 15 years. Yearly screening should also be stopped for people who develop a health problem that would prevent them from having lung cancer treatment.  If you are pregnant, do not drink alcohol. If you are breastfeeding, be very cautious about drinking alcohol. If you are not pregnant and choose to drink alcohol, do not exceed 1 drink per day. One drink is considered to be 12 ounces (355 mL) of beer, 5 ounces (148 mL) of wine, or  1.5 ounces (44 mL) of liquor.  Avoid use of street drugs. Do not share needles with anyone. Ask for help if you need support or instructions about stopping the use of drugs.  High blood pressure causes heart disease and increases the risk of stroke. Blood pressure should be checked at least every 1 to 2 years. Ongoing high blood pressure should be treated with medicines, if weight loss and exercise are not effective.  If you are 45 to 59 years old, ask your caregiver if you should  take aspirin to prevent strokes.  Diabetes screening involves taking a blood sample to check your fasting blood sugar level. This should be done once every 3 years, after age 41, if you are within normal weight and without risk factors for diabetes. Testing should be considered at a younger age or be carried out more frequently if you are overweight and have at least 1 risk factor for diabetes.  Breast cancer screening is essential preventative care for women. You should practice "breast self-awareness." This means understanding the normal appearance and feel of your breasts and may include breast self-examination. Any changes detected, no matter how small, should be reported to a caregiver. Women in their 58s and 30s should have a clinical breast exam (CBE) by a caregiver as part of a regular health exam every 1 to 3 years. After age 31, women should have a CBE every year. Starting at age 15, women should consider having a mammogram (breast X-ray) every year. Women who have a family history of breast cancer should talk to their caregiver about genetic screening. Women at a high risk of breast cancer should talk to their caregiver about having an MRI and a mammogram every year.  Breast cancer gene (BRCA)-related cancer risk assessment is recommended for women who have family members with BRCA-related cancers. BRCA-related cancers include breast, ovarian, tubal, and peritoneal cancers. Having family members with these cancers may be associated with an increased risk for harmful changes (mutations) in the breast cancer genes BRCA1 and BRCA2. Results of the assessment will determine the need for genetic counseling and BRCA1 and BRCA2 testing.  The Pap test is a screening test for cervical cancer. Women should have a Pap test starting at age 70. Between ages 69 and 5, Pap tests should be repeated every 2 years. Beginning at age 64, you should have a Pap test every 3 years as long as the past 3 Pap tests have  been normal. If you had a hysterectomy for a problem that was not cancer or a condition that could lead to cancer, then you no longer need Pap tests. If you are between ages 9 and 88, and you have had normal Pap tests going back 10 years, you no longer need Pap tests. If you have had past treatment for cervical cancer or a condition that could lead to cancer, you need Pap tests and screening for cancer for at least 20 years after your treatment. If Pap tests have been discontinued, risk factors (such as a new sexual partner) need to be reassessed to determine if screening should be resumed. Some women have medical problems that increase the chance of getting cervical cancer. In these cases, your caregiver may recommend more frequent screening and Pap tests.  The human papillomavirus (HPV) test is an additional test that may be used for cervical cancer screening. The HPV test looks for the virus that can cause the cell changes on the cervix. The cells collected during the Pap  test can be tested for HPV. The HPV test could be used to screen women aged 63 years and older, and should be used in women of any age who have unclear Pap test results. After the age of 17, women should have HPV testing at the same frequency as a Pap test.  Colorectal cancer can be detected and often prevented. Most routine colorectal cancer screening begins at the age of 31 and continues through age 90. However, your caregiver may recommend screening at an earlier age if you have risk factors for colon cancer. On a yearly basis, your caregiver may provide home test kits to check for hidden blood in the stool. Use of a small camera at the end of a tube, to directly examine the colon (sigmoidoscopy or colonoscopy), can detect the earliest forms of colorectal cancer. Talk to your caregiver about this at age 19, when routine screening begins. Direct examination of the colon should be repeated every 5 to 10 years through age 21, unless early  forms of pre-cancerous polyps or small growths are found.  Hepatitis C blood testing is recommended for all people born from [birth year] through 1966 and any individual with known risks for hepatitis C.  Practice safe sex. Use condoms and avoid high-risk sexual practices to reduce the spread of sexually transmitted infections (STIs). Sexually active women aged 12 and younger should be checked for Chlamydia, which is a common sexually transmitted infection. Older women with new or multiple partners should also be tested for Chlamydia. Testing for other STIs is recommended if you are sexually active and at increased risk.  Osteoporosis is a disease in which the bones lose minerals and strength with aging. This can result in serious bone fractures. The risk of osteoporosis can be identified using a bone density scan. Women ages 16 and over and women at risk for fractures or osteoporosis should discuss screening with their caregivers. Ask your caregiver whether you should be taking a calcium supplement or vitamin D to reduce the rate of osteoporosis.  Menopause can be associated with physical symptoms and risks. Hormone replacement therapy is available to decrease symptoms and risks. You should talk to your caregiver about whether hormone replacement therapy is right for you.  Use sunscreen. Apply sunscreen liberally and repeatedly throughout the day. You should seek shade when your shadow is shorter than you. Protect yourself by wearing long sleeves, pants, a wide-brimmed hat, and sunglasses year round, whenever you are outdoors.  Notify your caregiver of new moles or changes in moles, especially if there is a change in shape or color. Also notify your caregiver if a mole is larger than the size of a pencil eraser.  Stay current with your immunizations. Document Released: 09/13/2010 Document Revised: 06/25/2012 Document Reviewed: 09/13/2010 [facility] Patient Information 2014 [place].

## 2013-11-22 LAB — URINALYSIS W MICROSCOPIC + REFLEX CULTURE
Bilirubin Urine: NEGATIVE
CASTS: NONE SEEN
CRYSTALS: NONE SEEN
Glucose, UA: NEGATIVE mg/dL
HGB URINE DIPSTICK: NEGATIVE
Ketones, ur: 15 mg/dL — AB
Leukocytes, UA: NEGATIVE
Nitrite: NEGATIVE
Protein, ur: NEGATIVE mg/dL
Specific Gravity, Urine: 1.028 (ref 1.005–1.030)
Urobilinogen, UA: 0.2 mg/dL (ref 0.0–1.0)
pH: 5.5 (ref 5.0–8.0)

## 2013-11-22 LAB — LIPID PANEL
CHOL/HDL RATIO: 4.1 ratio
Cholesterol: 181 mg/dL (ref 0–200)
HDL: 44 mg/dL (ref >39)
LDL CALC: 108 mg/dL — AB (ref 0–99)
Triglycerides: 147 mg/dL (ref <150)
VLDL: 29 mg/dL (ref 0–40)

## 2013-11-22 LAB — COMPREHENSIVE METABOLIC PANEL
ALT: 17 U/L (ref 0–35)
AST: 21 U/L (ref 0–37)
Albumin: 4.4 g/dL (ref 3.5–5.2)
Alkaline Phosphatase: 81 U/L (ref 39–117)
BUN: 18 mg/dL (ref 6–23)
CALCIUM: 9.7 mg/dL (ref 8.4–10.5)
CHLORIDE: 101 meq/L (ref 96–112)
CO2: 27 meq/L (ref 19–32)
CREATININE: 0.88 mg/dL (ref 0.50–1.10)
Glucose, Bld: 92 mg/dL (ref 70–99)
Potassium: 4.4 mEq/L (ref 3.5–5.3)
Sodium: 140 mEq/L (ref 135–145)
Total Bilirubin: 0.3 mg/dL (ref 0.2–1.2)
Total Protein: 6.8 g/dL (ref 6.0–8.3)

## 2013-11-22 LAB — VITAMIN D 25 HYDROXY (VIT D DEFICIENCY, FRACTURES): Vit D, 25-Hydroxy: 52 ng/mL (ref 30–89)

## 2013-11-22 LAB — TSH: TSH: 1.458 u[IU]/mL (ref 0.350–4.500)

## 2013-11-24 LAB — URINE CULTURE

## 2013-11-25 ENCOUNTER — Other Ambulatory Visit: Payer: Self-pay | Admitting: Gynecology

## 2013-11-25 LAB — CYTOLOGY - PAP

## 2013-11-25 MED ORDER — SULFAMETHOXAZOLE-TMP DS 800-160 MG PO TABS: 1.0000 | ORAL_TABLET | Freq: Two times a day (BID) | ORAL | Status: DC

## 2014-01-13 ENCOUNTER — Encounter: Payer: Self-pay | Admitting: Gynecology

## 2014-04-15 ENCOUNTER — Encounter: Payer: Self-pay | Admitting: *Deleted

## 2014-04-15 ENCOUNTER — Telehealth: Payer: Self-pay | Admitting: *Deleted

## 2014-04-15 NOTE — Telephone Encounter (Signed)
We talked about starting back on a lower dose if she could not tolerate staying off the medication. Let's start with estradiol 0.5 mg daily with refill through her next exam. If this does not seem to control her symptoms she can call back and we can move her up to the 1 mg.

## 2014-04-15 NOTE — Telephone Encounter (Signed)
Pt called has been off estradiol 1 mg since 11/2013 had some slight hot flashes so she tried OTC for relief. The OTC medication gave no relief, pt would like to start back on estradiol told to call if this should happen. Please advise

## 2014-04-16 MED ORDER — ESTRADIOL 0.5 MG PO TABS: 0.5000 mg | ORAL_TABLET | Freq: Every day | ORAL | Status: DC

## 2014-04-16 NOTE — Telephone Encounter (Signed)
Left on voicemail,rx sent.

## 2014-06-19 ENCOUNTER — Encounter: Payer: Self-pay | Admitting: Gynecology

## 2014-08-10 ENCOUNTER — Emergency Department (HOSPITAL_COMMUNITY)
Admission: EM | Admit: 2014-08-10 | Discharge: 2014-08-10 | Disposition: A | Discharge status: Home / Self Care | Payer: BLUE CROSS/BLUE SHIELD | Attending: Emergency Medicine | Admitting: Emergency Medicine

## 2014-08-10 ENCOUNTER — Encounter (HOSPITAL_COMMUNITY): Payer: Self-pay | Admitting: *Deleted

## 2014-08-10 ENCOUNTER — Emergency Department (HOSPITAL_COMMUNITY): Payer: BLUE CROSS/BLUE SHIELD

## 2014-08-10 DIAGNOSIS — R079 Chest pain, unspecified: Secondary | ICD-10-CM | POA: Diagnosis not present

## 2014-08-10 DIAGNOSIS — I4891 Unspecified atrial fibrillation: Secondary | ICD-10-CM | POA: Insufficient documentation

## 2014-08-10 DIAGNOSIS — Z8719 Personal history of other diseases of the digestive system: Secondary | ICD-10-CM | POA: Diagnosis not present

## 2014-08-10 DIAGNOSIS — Z87442 Personal history of urinary calculi: Secondary | ICD-10-CM | POA: Diagnosis not present

## 2014-08-10 DIAGNOSIS — Z79899 Other long term (current) drug therapy: Secondary | ICD-10-CM | POA: Diagnosis not present

## 2014-08-10 DIAGNOSIS — R0602 Shortness of breath: Secondary | ICD-10-CM | POA: Diagnosis present

## 2014-08-10 DIAGNOSIS — M1990 Unspecified osteoarthritis, unspecified site: Secondary | ICD-10-CM | POA: Diagnosis not present

## 2014-08-10 LAB — CBC
HEMATOCRIT: 40.3 % (ref 36.0–46.0)
Hemoglobin: 13.7 g/dL (ref 12.0–15.0)
MCH: 30.9 pg (ref 26.0–34.0)
MCHC: 34 g/dL (ref 30.0–36.0)
MCV: 91 fL (ref 78.0–100.0)
PLATELETS: 231 10*3/uL (ref 150–400)
RBC: 4.43 MIL/uL (ref 3.87–5.11)
RDW: 12.9 % (ref 11.5–15.5)
WBC: 6.2 10*3/uL (ref 4.0–10.5)

## 2014-08-10 LAB — TSH: TSH: 3.838 u[IU]/mL (ref 0.350–4.500)

## 2014-08-10 LAB — BASIC METABOLIC PANEL
ANION GAP: 10 (ref 5–15)
BUN: 18 mg/dL (ref 6–20)
CO2: 23 mmol/L (ref 22–32)
Calcium: 9.6 mg/dL (ref 8.9–10.3)
Chloride: 103 mmol/L (ref 101–111)
Creatinine, Ser: 0.75 mg/dL (ref 0.44–1.00)
GFR calc non Af Amer: >60 mL/min (ref >60)
Glucose, Bld: 100 mg/dL — ABNORMAL HIGH (ref 65–99)
Potassium: 4 mmol/L (ref 3.5–5.1)
SODIUM: 136 mmol/L (ref 135–145)

## 2014-08-10 LAB — I-STAT TROPONIN, ED: TROPONIN I, POC: 0 ng/mL (ref 0.00–0.08)

## 2014-08-10 LAB — D-DIMER, QUANTITATIVE (NOT AT ARMC): D DIMER QUANT: 0.34 ug{FEU}/mL (ref 0.00–0.48)

## 2014-08-10 MED ORDER — METOPROLOL TARTRATE 25 MG PO TABS: 100.0000 mg | ORAL_TABLET | Freq: Two times a day (BID) | ORAL | Status: DC

## 2014-08-10 MED ORDER — ASPIRIN 81 MG PO CHEW: 81.0000 mg | CHEWABLE_TABLET | Freq: Every day | ORAL | Status: DC

## 2014-08-10 NOTE — Discharge Instructions (Signed)

## 2014-08-10 NOTE — ED Provider Notes (Signed)
CSN: [id number]     Arrival date & time 08/10/14  0340 History   First MD Initiated Contact with Patient 08/10/14 [phone]     Chief Complaint  Patient presents with  . Shortness of Breath     (Consider location/radiation/quality/duration/timing/severity/associated sxs/prior Treatment) HPI Patient presents with 2 days of intermittent shortness of breath and chest pressure. The symptoms are not exertional. They last for several minutes and then dissipate. Patient then became having constant chest pressure and shortness of breath starting at 1:30 AM. She was unable to go back to sleep. Pressure does not radiate. No lightheadedness. Denies any nausea or vomiting. Admits to irregular palpitations. Has never seen a cardiologist in the past and no history of coronary artery disease. No recent extended travel and no lower extremity swelling or pain. Past Medical History  Diagnosis Date  . Osteoarthritis   . Vertigo   . Herniated disc   . Renal calculus   . History of headache   . Colitis    Past Surgical History  Procedure Laterality Date  . Back surgery  2004  . Shoulder surgery    . Gynecologic cryosurgery  1982  . Vaginal hysterectomy  1991    CIS of the cervix   Family History  Problem Relation Age of Onset  . Breast cancer Maternal Aunt     mid 30's   History  Substance Use Topics  . Smoking status: Never Smoker   . Smokeless tobacco: Never Used  . Alcohol Use: 1.5 oz/week    3 drink(s) per week   OB History    Gravida Para Term Preterm AB TAB SAB Ectopic Multiple Living   Review of Systems  Constitutional: Negative for fever, chills and fatigue.  Respiratory: Positive for shortness of breath. Negative for wheezing.   Cardiovascular: Positive for chest pain. Negative for palpitations and leg swelling.  Gastrointestinal: Negative for nausea, vomiting and abdominal pain.  Musculoskeletal: Negative for back pain, neck pain and neck stiffness.  Skin:  Negative for rash and wound.  Neurological: Negative for dizziness, weakness, light-headedness, numbness and headaches.  All other systems reviewed and are negative.     Allergies  Propoxyphene hcl  Home Medications   Prior to Admission medications   Medication Sig Start Date End Date Taking? Authorizing Provider  aspirin-acetaminophen-caffeine (EXCEDRIN MIGRAINE) [phone] MG per tablet Take 2 tablets by mouth every 6 (six) hours as needed for headache.   Yes Historical Provider, MD  estradiol (ESTRACE) 0.5 MG tablet Take 1 tablet (0.5 mg total) by mouth daily. 04/16/14  Yes Dara Lords, MD  ibuprofen (ADVIL,MOTRIN) 200 MG tablet Take 400 mg by mouth every 6 (six) hours as needed for moderate pain.    Yes Historical Provider, MD  aspirin 81 MG chewable tablet Chew 1 tablet (81 mg total) by mouth daily. 08/10/14   Loren Racer, MD  metoprolol (LOPRESSOR) 25 MG tablet Take 4 tablets (100 mg total) by mouth 2 (two) times daily. 08/10/14   Loren Racer, MD   BP 103/62 mmHg  Pulse 72  Temp(Src) 98.3 F (36.8 C)  Resp 14  Ht  (1.626 m)  Wt 160 lb (72.576 kg)  BMI 27.45 kg/m2  SpO2 96% Physical Exam  Constitutional: She is oriented to person, place, and time. She appears well-developed and well-nourished. No distress.  HENT:  Head: Normocephalic and atraumatic.  Mouth/Throat: Oropharynx is clear and moist.  Eyes:  EOM are normal. Pupils are equal, round, and reactive to light.  Neck: Normal range of motion. Neck supple. No thyromegaly present.  Cardiovascular:  Irregularly irregular  Pulmonary/Chest: Effort normal and breath sounds normal. No respiratory distress. She has no wheezes. She has no rales.  Abdominal: Soft. Bowel sounds are normal. She exhibits no distension and no mass. There is no tenderness. There is no rebound and no guarding.  Musculoskeletal: Normal range of motion. She exhibits no edema or tenderness.  No calf swelling or tenderness.  Neurological:  She is alert and oriented to person, place, and time.  Moves all extremities without deficit. Sensation is grossly intact.  Skin: Skin is warm and dry. No rash noted. No erythema.  Psychiatric: She has a normal mood and affect. Her behavior is normal.  Nursing note and vitals reviewed.   ED Course  Procedures (including critical care time) Labs Review Labs Reviewed  BASIC METABOLIC PANEL - Abnormal; Notable for the following:    Glucose, Bld 100 (*)    All other components within normal limits  CBC  D-DIMER, QUANTITATIVE (NOT AT [facility])  TSH  URINALYSIS, ROUTINE W REFLEX MICROSCOPIC (NOT AT [facility])  I-STAT TROPOININ, ED    Imaging Review Dg Chest 2 View  08/10/2014   CLINICAL DATA:  Intermittent shortness of breath and chest pressure with tightness for 2 days.  EXAM: CHEST  2 VIEW  COMPARISON:  Chest radiograph April 09, 2009  FINDINGS: Cardiomediastinal silhouette is unremarkable. Lingular atelectasis/ scarring. The lungs are otherwise clear without pleural effusions or focal consolidations. Trachea projects midline and there is no pneumothorax. Soft tissue planes and included osseous structures are non-suspicious. Mild degenerative change of the thoracic spine.  IMPRESSION: No active cardiopulmonary disease.   Electronically Signed   By: Awilda Metroourtnay  Bloomer M.D.   On: 08/10/2014 05:44     EKG Interpretation   Date/Time:  Sunday Aug 10 2014 03:53:01 EDT Ventricular Rate:  98 PR Interval:    QRS Duration: 82 QT Interval:  342 QTC Calculation: 436 R Axis:   47 Text Interpretation:  Atrial fibrillation Abnormal ECG Confirmed by  Ehsan Corvin  MD, Daliah Chaudoin ([id number]) on 08/10/2014 6:42:29 AM      MDM   Final diagnoses:  New onset a-fib   Patient with spontaneous conversion of atrial fibrillation into normal sinus rhythm. Patient states her symptoms have completely resolved. Patient's CHADS2-VASc is 1 for gender. Discussed with Dr. Antoine PocheHochrein. Recommends starting the patient on low-dose  beta blocker, Metoprolol 25 mg twice a day and following up in the office. Discussed with patient and she understands the need to follow-up and return precautions.     Loren Raceravid Ariyon Mittleman, MD 08/10/14 [phone]

## 2014-08-10 NOTE — ED Notes (Signed)
The pt is c/o sob for 2 days after working in the heat  2 days ago.

## 2014-08-12 ENCOUNTER — Telehealth: Payer: Self-pay | Admitting: Cardiology

## 2014-08-12 ENCOUNTER — Telehealth: Payer: Self-pay | Admitting: Cardiovascular Disease

## 2014-08-12 NOTE — Telephone Encounter (Signed)
Pt's husband called in concerned about the directions on his wife's Lopressor prescription. He says that in the hospital they were told to take 1/2 tab in the AM and 1/2 tab in PM but on the paper its says to take 2 tabs in the AM and 2 tabs in the PM. He would like to know which directions are correct. Please call back  Thanks

## 2014-08-12 NOTE — Telephone Encounter (Signed)
Attempted to call patient's husband - he is in physical therapy. He will call back. He reports his wife is in a meeting.   Per ED note, patient is to take metoprolol tartrate 25mg  BID Final diagnoses:  New onset a-fib   Patient with spontaneous conversion of atrial fibrillation into normal sinus rhythm. Patient states her symptoms have completely resolved. Patient's CHADS2-VASc is 1 for gender. Discussed with Dr. Antoine PocheHochrein. Recommends starting the patient on low-dose beta blocker, Metoprolol 25 mg twice a day and following up in the office. Discussed with patient and she understands the need to follow-up and return precautions.

## 2014-08-12 NOTE — Telephone Encounter (Signed)
Patient's husband walked in after physical therapy. He is a patient of Dr. Tresa EndoKelly.   He reports his wife has been taking metoprolol tartrate 12.5mg  BID since ED visit. Both he and the patient feel 25mg  BID as noted in ED report is too much. Rx given to patient was for 25mg  but different instructions from ED note.   Advised husband that patient take medication as she has been taking (BP and HR controlled in ED) and monitor VS BID - after sitting 5 minutes in calm environment.   Informed husband I would seek clarification from either [place], [place] or Vernona RiegerLaura, NP - per preference.   Informed husband patient should follow up - he prefers Dr. Tresa EndoKelly as that is who he sees, but he is booked til September - will send staff message to scheduler to see if patient can see extender.   Will defer message to [place], [place] and Vernona RiegerLaura, NP to advise on medication discrepancy if possible

## 2014-08-12 NOTE — Telephone Encounter (Signed)
Called, left msg for case manager, advised no CHF history for patient, any other questions to return call.

## 2014-08-12 NOTE — Telephone Encounter (Signed)
Shirley BabinskiMarilyn [facility] [employer] is calling to see if Mrs. Shirley Coolerodd has CHF , so that she can teach her how weigh and report weight gain etc if she truly has heart failure . Please call . Can leave a msg..Marland Kitchen

## 2014-08-14 ENCOUNTER — Telehealth: Payer: Self-pay | Admitting: Cardiology

## 2014-08-19 NOTE — Telephone Encounter (Signed)
Close encounter 

## 2014-08-19 NOTE — Telephone Encounter (Signed)
Can this encounter be closed?

## 2014-08-20 NOTE — Telephone Encounter (Signed)
Closed encounter °

## 2014-08-20 NOTE — Telephone Encounter (Signed)
Pt scheduled for extender visit.

## 2014-08-27 ENCOUNTER — Ambulatory Visit (INDEPENDENT_AMBULATORY_CARE_PROVIDER_SITE_OTHER): Payer: BLUE CROSS/BLUE SHIELD | Admitting: Cardiology

## 2014-08-27 ENCOUNTER — Encounter: Payer: Self-pay | Admitting: Cardiology

## 2014-08-27 VITALS — BP 124/62 | HR 65 | Ht 64.0 in | Wt 159.0 lb

## 2014-08-27 DIAGNOSIS — I480 Paroxysmal atrial fibrillation: Secondary | ICD-10-CM | POA: Diagnosis not present

## 2014-08-27 MED ORDER — METOPROLOL TARTRATE 25 MG PO TABS: 12.5000 mg | ORAL_TABLET | Freq: Every day | ORAL | Status: DC

## 2014-08-27 NOTE — Patient Instructions (Signed)
Medication Instructions:  Your physician recommends that you continue on your current medications as directed. Please refer to the Current Medication list given to you today.   Labwork:   Testing/Procedures:  Your physician has requested that you have an echocardiogram. Echocardiography is a painless test that uses sound waves to create images of your heart. It provides your doctor with information about the size and shape of your heart and how well your heart's chambers and valves are working. This procedure takes approximately one hour. There are no restrictions for this procedure.  Follow-Up:  WITH DR Tresa Endo AS A NEW PATIENT THE NEXT AVAILABLE  APPT    Any Other Special Instructions Will Be Listed Below (If Applicable).

## 2014-08-27 NOTE — Progress Notes (Signed)
08/27/2014 Shirley Carroll Shirley Carroll   09-Aug-1955  [id number]  Primary Physician NADEL,SCOTT Judie Petit, MD Primary Cardiologist: New (Request Dr. Tresa Endo)  Reason for Visit/CC: Post ED F/u for Newly Diagnosed Atrial Fibrillation  HPI:  The patient is a 59 y/o female with no prior PMH who presents to clinic for post ED f/u. She presented [facility] ED on 5/29 with a 2 day h/o intermittent SOB and chest pressure. EKG in the ED demonstrated newly diagnosed atrial fibrillation with a CVR of 98 bpm. TSH, CBC, BMP, troponin, d-dimer and CXR were all unremarkable. While in the ED, she spontaneously converted back to NSR and symptoms resolved. Dr. Antoine Poche was contacted and recommended starting her on low dose metoprolol, 12.5 mg BID + OP f/u. Given a low CHA2DS2 VASc score of 1 (female sex), daily ASA was recommended. She denies personal history of CAD, stroke/TIA, HTN, HLD and DM.   She presents to clinic today, accompanied by her husband. She denies any recurrent symptoms of breakthrough atrial fibrillation. No recurrent CP, dyspnea or palpitations. She is very active. She walks 2 miles a day without chest pain. She has has been compliant with daily use of metoprolol and ASA but reports that she reduced metoprolol to once daily due to fatigue. Once daily dosing has worked well for her.   EKG today shows NSR with HR of 64 bpm. BP is stable.    Current Outpatient Prescriptions  Medication Sig Dispense Refill  . aspirin 81 MG chewable tablet Chew 1 tablet (81 mg total) by mouth daily. 30 tablet 0  . aspirin-acetaminophen-caffeine (EXCEDRIN MIGRAINE) 250-250-65 MG per tablet Take 2 tablets by mouth every 6 (six) hours as needed for headache.    . estradiol (ESTRACE) 0.5 MG tablet Take 1 tablet (0.5 mg total) by mouth daily. 30 tablet 8  . ibuprofen (ADVIL,MOTRIN) 200 MG tablet Take 400 mg by mouth every 6 (six) hours as needed for moderate pain.     . metoprolol (LOPRESSOR) 25 MG tablet Take 4 tablets (100 mg total) by  mouth 2 (two) times daily. 60 tablet 0   No current facility-administered medications for this visit.    Allergies  Allergen Reactions  . Propoxyphene Hcl     REACTION: extreme nausea  Darvon    History   Social History  . Marital Status: Married    Spouse Name: Film/video editor  . Number of Children: N/A  . Years of Education: N/A   Occupational History  .     Social History Main Topics  . Smoking status: Never Smoker   . Smokeless tobacco: Never Used  . Alcohol Use: 1.5 oz/week    3 drink(s) per week  . Drug Use: No  . Sexual Activity: Yes    Birth Control/ Protection: Surgical   Other Topics Concern  . Not on file   Social History Narrative     Review of Systems: General: negative for chills, fever, night sweats or weight changes.  Cardiovascular: negative for chest pain, dyspnea on exertion, edema, orthopnea, palpitations, paroxysmal nocturnal dyspnea or shortness of breath Dermatological: negative for rash Respiratory: negative for cough or wheezing Urologic: negative for hematuria Abdominal: negative for nausea, vomiting, diarrhea, bright red blood per rectum, melena, or hematemesis Neurologic: negative for visual changes, syncope, or dizziness All other systems reviewed and are otherwise negative except as noted above.    Blood pressure 124/62, pulse 65, height 5\' 4"  (1.626 m), weight 159 lb (72.122 kg).  General appearance: alert, cooperative and  no distress Neck: no carotid bruit and no JVD Lungs: clear to auscultation bilaterally Heart: regular rate and rhythm, S1, S2 normal, no murmur, click, rub or gallop Extremities: no LEE Pulses: 2+ and symmetric Skin: warm and dry Neurologic: Grossly normal  EKG NSR 64 bpm   ASSESSMENT AND PLAN:   1. PAF: patient spontaneously converted to NSR while in the ED on 5/29. She denies any further recurrence. Recent TSH, CBC and BMP normal. Will check a 2D echo. Continue metoprolol for rate control. Can continue low dose  daily ASA given low CHA2DS2 VASc score of 1 (female sex). F/u with Dr. Tresa Endo.    PLAN  F/u with Dr. Tresa Endo in 4-6 weeks.   Shawntez Dickison PA-C 08/27/2014 9:20 AM

## 2014-09-02 ENCOUNTER — Other Ambulatory Visit: Payer: Self-pay

## 2014-09-02 ENCOUNTER — Ambulatory Visit (HOSPITAL_COMMUNITY): Payer: BLUE CROSS/BLUE SHIELD | Attending: Cardiology

## 2014-09-02 DIAGNOSIS — I480 Paroxysmal atrial fibrillation: Secondary | ICD-10-CM | POA: Diagnosis not present

## 2014-09-10 ENCOUNTER — Encounter: Payer: BLUE CROSS/BLUE SHIELD | Admitting: Cardiology

## 2014-09-11 ENCOUNTER — Telehealth: Payer: Self-pay | Admitting: *Deleted

## 2014-09-11 NOTE — Telephone Encounter (Signed)
-----   Message from [place] M Simmons, [place] sent at 09/05/2014  1:17 PM EDT ----- Pump function is normal but she has mild abnormality with relaxation of heart muscle (inform her this is not uncommon for her age). Advise to refrain from salt and monitor BP at home. Keep f/u with Dr. Tresa EndoKelly.

## 2014-12-08 ENCOUNTER — Ambulatory Visit (INDEPENDENT_AMBULATORY_CARE_PROVIDER_SITE_OTHER): Payer: BLUE CROSS/BLUE SHIELD | Admitting: Cardiovascular Disease

## 2014-12-08 VITALS — BP 112/78 | HR 66 | Ht 64.0 in | Wt 165.5 lb

## 2014-12-08 DIAGNOSIS — Z13220 Encounter for screening for lipoid disorders: Secondary | ICD-10-CM | POA: Diagnosis not present

## 2014-12-08 DIAGNOSIS — R0789 Other chest pain: Secondary | ICD-10-CM | POA: Diagnosis not present

## 2014-12-08 DIAGNOSIS — I480 Paroxysmal atrial fibrillation: Secondary | ICD-10-CM | POA: Diagnosis not present

## 2014-12-08 DIAGNOSIS — Z8669 Personal history of other diseases of the nervous system and sense organs: Secondary | ICD-10-CM

## 2014-12-08 DIAGNOSIS — R079 Chest pain, unspecified: Secondary | ICD-10-CM

## 2014-12-08 LAB — LIPID PANEL
CHOLESTEROL: 189 mg/dL (ref 125–200)
HDL: 62 mg/dL (ref >=46)
LDL Cholesterol: 105 mg/dL (ref <130)
TRIGLYCERIDES: 112 mg/dL (ref <150)
Total CHOL/HDL Ratio: 3 Ratio (ref <=5.0)
VLDL: 22 mg/dL (ref <30)

## 2014-12-08 LAB — COMPREHENSIVE METABOLIC PANEL
ALBUMIN: 3.9 g/dL (ref 3.6–5.1)
ALK PHOS: 88 U/L (ref 33–130)
ALT: 14 U/L (ref 6–29)
AST: 15 U/L (ref 10–35)
BILIRUBIN TOTAL: 0.4 mg/dL (ref 0.2–1.2)
BUN: 16 mg/dL (ref 7–25)
CO2: 29 mmol/L (ref 20–31)
CREATININE: 0.81 mg/dL (ref 0.50–0.99)
Calcium: 9.4 mg/dL (ref 8.6–10.4)
Chloride: 104 mmol/L (ref 98–110)
Glucose, Bld: 100 mg/dL — ABNORMAL HIGH (ref 65–99)
Potassium: 4.8 mmol/L (ref 3.5–5.3)
SODIUM: 139 mmol/L (ref 135–146)
TOTAL PROTEIN: 6.2 g/dL (ref 6.1–8.1)

## 2014-12-08 MED ORDER — METOPROLOL SUCCINATE ER 25 MG PO TB24: 12.5000 mg | ORAL_TABLET | Freq: Every day | ORAL | Status: DC

## 2014-12-08 NOTE — Patient Instructions (Addendum)
Your physician has recommended you make the following change in your medication:  metoprolol succ ER (long acting) this has already been sent to your [facility] pharmacy. Continue the lopressor on a as needed basis.  Your physician recommends that you return for lab work.  Your physician has requested that you have an exercise tolerance test. For further information please visit [email or web address] Please also follow instruction sheet, as given.  Your physician wants you to follow-up in: 1 year or sooner if needed. You will receive a reminder letter in the mail two months in advance. If you don't receive a letter, please call our office to schedule the follow-up appointment.

## 2014-12-10 ENCOUNTER — Encounter: Payer: Self-pay | Admitting: Cardiovascular Disease

## 2014-12-10 DIAGNOSIS — Z8669 Personal history of other diseases of the nervous system and sense organs: Secondary | ICD-10-CM | POA: Insufficient documentation

## 2014-12-10 DIAGNOSIS — R0789 Other chest pain: Secondary | ICD-10-CM | POA: Insufficient documentation

## 2014-12-10 NOTE — Progress Notes (Signed)
Patient ID: Shirley Carroll, female   DOB: 1955/05/04, 59 y.o.   MRN: [id number]     HPI: Shirley Carroll is a 59 y.o. female who presents to the office today to establish cardiology care with me.  She is a patient who has been followed by Dr. Nicki Reaper and a dull, for primary care and recently had seen Lesia Hausen, PA, following an episode of atrial fibrillation.  She presents for cardiology evaluation.  Shirley Carroll denies any known cardiac history.  On 08/10/2014 she presented to the emergency room after 2 day history of intermittent shortness of breath and chest pressure.  She was found to have atrial fibrillation on ECG and the ER with a controlled ventricular rate at 98 bpm.  Laboratory was unremarkable including TSH, CBC, BMP, troponin and d-dimer.  She is negative.  Chest x-ray.  She was started on low-dose metoprolol, tartrate 12.5 mg twice a day with follow-up recommended.  She has a chads 2 Vascor of 1 and anticoagulation therapy was not instituted.  She saw Brittney in June.  She has requested that I see her in follow-up for cardiology care since I take care of her husband and she presents for evaluation.  She underwent an echo Doppler study on 09/02/2014.  This revealed normal systolic function with grade 2 diastolic dysfunction.  Trivial mitral regurgitation.  There was very minimal increased.  PA pressure 32 mm.  She denies any recurrent episodes of palpitations.  She walks every day and and denies exertional symptomatology.  At times, while sitting, she noticed sensation of chest pressure.  She presents for evaluation  Past Medical History  Diagnosis Date  . Osteoarthritis   . Vertigo   . Herniated disc   . Renal calculus   . History of headache   . Colitis     Past Surgical History  Procedure Laterality Date  . Back surgery  2004  . Shoulder surgery    . Gynecologic cryosurgery  1982  . Vaginal hysterectomy  1991    CIS of the cervix    Allergies  Allergen Reactions  .  Propoxyphene Hcl     REACTION: extreme nausea  Darvon    Current Outpatient Prescriptions  Medication Sig Dispense Refill  . aspirin 81 MG chewable tablet Chew 1 tablet (81 mg total) by mouth daily. 30 tablet 0  . aspirin-acetaminophen-caffeine (EXCEDRIN MIGRAINE) 409-811-91 MG per tablet Take 2 tablets by mouth every 6 (six) hours as needed for headache.    . estradiol (ESTRACE) 0.5 MG tablet Take 1 tablet (0.5 mg total) by mouth daily. 30 tablet 8  . fexofenadine-pseudoephedrine (ALLEGRA-D 24) 180-240 MG per 24 hr tablet Take 1 tablet by mouth daily.    Marland Kitchen ibuprofen (ADVIL,MOTRIN) 200 MG tablet Take 400 mg by mouth every 6 (six) hours as needed for moderate pain.     . metoprolol tartrate (LOPRESSOR) 25 MG tablet Take 0.5 tablets (12.5 mg total) by mouth daily. IN THE EVENING 30 tablet 6  . metoprolol succinate (TOPROL XL) 25 MG 24 hr tablet Take 0.5 tablets (12.5 mg total) by mouth at bedtime. 30 tablet 6   No current facility-administered medications for this visit.    Social History   Social History  . Marital Status: Married    Spouse Name: Photographer  . Number of Children: N/A  . Years of Education: N/A   Occupational History  .     Social History Main Topics  . Smoking status: Never Smoker   .  Smokeless tobacco: Never Used  . Alcohol Use: 1.5 oz/week    3 drink(s) per week  . Drug Use: No  . Sexual Activity: Yes    Birth Control/ Protection: Surgical   Other Topics Concern  . 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  She is married for 33 years.  She has 1 daughter age 61 and 2 grandchildren.  Family History  Problem Relation Age of Onset  . Breast cancer Maternal Aunt     mid 10's   Family history is notable that mother has a history of atrial fibrillation.  Father has heart disease.  She does not have any siblings.  ROS General: Negative; No fevers, chills, or night sweats HEENT:  Negative; No changes in vision or hearing, sinus congestion, difficulty swallowing Pulmonary: Negative; No cough, wheezing, shortness of breath, hemoptysis Cardiovascular: See HPI: No chest pain, presyncope, syncope, palpatations GI: Negative; No nausea, vomiting, diarrhea, or abdominal pain GU: Negative; No dysuria, hematuria, or difficulty voiding Musculoskeletal: Negative; no myalgias, joint pain, or weakness Hematologic: Negative; no easy bruising, bleeding Endocrine: Negative; no heat/cold intolerance; no diabetes, Neuro: Negative; no changes in balance, headaches Skin: Negative; No rashes or skin lesions Psychiatric: Negative; No behavioral problems, depression Sleep: Negative; No snoring,  daytime sleepiness, hypersomnolence, bruxism, restless legs, hypnogognic hallucinations. Other comprehensive 14 point system review is negative   Physical Exam BP 112/78 mmHg  Pulse 66  Ht _0  (1.626 m)  Wt 165 lb 8 oz (75.07 kg)  BMI 28.39 kg/m2 Wt Readings from Last 3 Encounters:  12/08/14 165 lb 8 oz (75.07 kg)  08/27/14 159 lb (72.122 kg)  08/10/14 160 lb (72.576 kg)   General: Alert, oriented, no distress.  Skin: normal turgor, no rashes, warm and dry HEENT: Normocephalic, atraumatic. Pupils equal round and reactive to light; sclera anicteric; extraocular muscles intact, No lid lag; Nose without nasal septal hypertrophy; Mouth/Parynx benign; Mallinpatti scale 3 Neck: No JVD, no carotid bruits; normal carotid upstroke Lungs: clear to ausculatation and percussion bilaterally; no wheezing or rales, normal inspiratory and expiratory effort Chest wall: without tenderness to palpitation Heart: PMI not displaced, RRR, s1 s2 normal, 1/6 systolic murmur, No diastolic murmur, no rubs, gallops, thrills, or heaves Abdomen: soft, nontender; no hepatosplenomehaly, BS+; abdominal aorta nontender and not dilated by palpation. Back: no CVA tenderness Pulses: 2+  Musculoskeletal: full range of  motion, normal strength, no joint deformities Extremities: Pulses 2+, no clubbing cyanosis or edema, Homan's sign negative  Neurologic: grossly nonfocal; Cranial nerves grossly wnl Psychologic: Normal mood and affect   ECG (independently read by me): Normal sinus rhythm at 66 bpm.  No significant ST-T changes.  LABS:  BMP Latest Ref Rng 12/08/2014 08/10/2014 11/21/2013  Glucose 65 - 99 mg/dL 100(H) 100(H) 92  BUN 7 - 25 mg/dL _1 Creatinine 0.50 - 0.99 mg/dL 0.81 0.75 0.88  Sodium 135 - 146 mmol/L 139 136 140  Potassium 3.5 - 5.3 mmol/L 4.8 4.0 4.4  Chloride 98 - 110 mmol/L 104 103 101  CO2 20 - 31 mmol/L _2 Calcium 8.6 - 10.4 mg/dL 9.4 9.6 9.7   Hepatic Function Latest Ref Rng 12/08/2014 11/21/2013 08/15/2012  Total Protein 6.1 - 8.1 g/dL 6.2 6.8 6.3  Albumin 3.6 - 5.1 g/dL 3.9 4.4 4.1  AST 10 - 35 U/L _3 ALT 6 - 29 U/L _4 Alk Phosphatase  33 - 130 U/L 88 81 87  Total Bilirubin 0.2 - 1.2 mg/dL 0.4 0.3 0.3  Bilirubin, Direct 0.0-0.3 mg/dL - - -   CBC Latest Ref Rng 08/10/2014 11/21/2013 08/15/2012  WBC 4.0 - 10.5 K/uL 6.2 5.9 5.6  Hemoglobin 12.0 - 15.0 g/dL 13.7 13.1 12.2  Hematocrit 36.0 - 46.0 % 40.3 37.9 36.1  Platelets 150 - 400 K/uL 231 283 268   Lab Results  Component Value Date   MCV 91.0 08/10/2014   MCV 90.0 11/21/2013   MCV 89.6 08/15/2012    Lab Results  Component Value Date   TSH 3.838 08/10/2014    BNP No results found for: BNP  ProBNP No results found for: PROBNP   Lipid Panel     Component Value Date/Time   CHOL 189 12/08/2014 0927   TRIG 112 12/08/2014 0927   HDL 62 12/08/2014 0927   CHOLHDL 3.0 12/08/2014 0927   VLDL 22 12/08/2014 0927   LDLCALC 105 12/08/2014 0927     RADIOLOGY: No results found.    ASSESSMENT AND PLAN: Shirley Carroll is a 59 year old female who was recently found to develop an episode of paroxysmal atrial fibrillation which self resolved.  She was advised to take metoprolol, tartrate 12.5 mg  twice a day, but has only been taking this 12.5 mg at bedtime.  I discussed with her that this is a shorter acting preparation.  It would not last 24 4 hours.  For this reason, I will change this to metoprolol succinate and she will continue to take 12.5 mg at bedtime.  With her sensation of chest discomfort.  I'm also recommending that she undergo a routine graded exercise treadmill test which would be helpful to assess exercise-induced arrhythmia and is a screening test for underlying CAD, particularly with family history in her father.  Laboratory will be obtained today.  I reviewed her most recent echo Doppler study with her in detail.  I will contact her regarding the results of her above tests.  If abnormal, I will see her back for follow-up evaluation.  Otherwise, if stable, I will see her in one year for annual assessment.  [name], MD, [facility]  12/10/2014 3:24 PM

## 2015-01-02 ENCOUNTER — Telehealth (HOSPITAL_COMMUNITY): Payer: Self-pay

## 2015-01-02 NOTE — Telephone Encounter (Signed)
Encounter complete. 

## 2015-01-07 ENCOUNTER — Inpatient Hospital Stay (HOSPITAL_COMMUNITY): Admission: RE | Admit: 2015-01-07 | Payer: BLUE CROSS/BLUE SHIELD | Source: Ambulatory Visit

## 2015-01-27 ENCOUNTER — Telehealth (HOSPITAL_COMMUNITY): Payer: Self-pay | Admitting: *Deleted

## 2015-01-29 ENCOUNTER — Telehealth (HOSPITAL_COMMUNITY): Payer: Self-pay | Admitting: *Deleted

## 2015-02-09 ENCOUNTER — Other Ambulatory Visit: Payer: Self-pay | Admitting: Gynecology

## 2015-02-11 ENCOUNTER — Telehealth: Payer: Self-pay

## 2015-02-11 NOTE — Telephone Encounter (Signed)
Patient's husband called inquiring regarding getting patient's estradiol refilled. I explained to him, per DPR access on file, that patient is ovedue for annual exam since last one was 11/21/2013.  I told him she will need to call and schedule CE and then call her pharmacy and request a refill and I can refill it once her annual is on the books.

## 2015-02-17 ENCOUNTER — Other Ambulatory Visit: Payer: Self-pay | Admitting: *Deleted

## 2015-02-17 DIAGNOSIS — R079 Chest pain, unspecified: Secondary | ICD-10-CM

## 2015-02-17 DIAGNOSIS — R0789 Other chest pain: Secondary | ICD-10-CM

## 2015-02-18 ENCOUNTER — Other Ambulatory Visit: Payer: Self-pay | Admitting: *Deleted

## 2015-02-18 DIAGNOSIS — R0789 Other chest pain: Secondary | ICD-10-CM

## 2015-02-25 ENCOUNTER — Telehealth: Payer: Self-pay | Admitting: *Deleted

## 2015-02-25 MED ORDER — ESTRADIOL 0.5 MG PO TABS: 0.5000 mg | ORAL_TABLET | Freq: Every day | ORAL | Status: DC

## 2015-02-25 NOTE — Telephone Encounter (Signed)
Pt has annual scheduled on 04/21/14, needs refills on estradiol 0.5 mg, Rx sent.

## 2015-03-17 ENCOUNTER — Telehealth (HOSPITAL_COMMUNITY): Payer: Self-pay

## 2015-03-17 NOTE — Telephone Encounter (Signed)
Encounter complete. 

## 2015-03-19 ENCOUNTER — Ambulatory Visit (HOSPITAL_COMMUNITY)
Admission: RE | Admit: 2015-03-19 | Discharge: 2015-03-19 | Disposition: A | Discharge status: Home / Self Care | Payer: BLUE CROSS/BLUE SHIELD | Source: Ambulatory Visit | Attending: Cardiovascular Disease | Admitting: Cardiovascular Disease

## 2015-03-19 DIAGNOSIS — R0789 Other chest pain: Secondary | ICD-10-CM | POA: Insufficient documentation

## 2015-03-20 LAB — EXERCISE TOLERANCE TEST
CHL CUP MPHR: 160 {beats}/min
CHL CUP RESTING HR STRESS: 78 {beats}/min
CSEPEW: 10.1 METS
Exercise duration (min): 9 min
Peak HR: 151 {beats}/min
Percent HR: 94 %
RPE: 16

## 2015-03-25 ENCOUNTER — Telehealth: Payer: Self-pay | Admitting: Pulmonary Disease

## 2015-03-25 NOTE — Telephone Encounter (Signed)
Spoke with pt's husband. Pt had her flu shot at the beginning of November. This has been documented. SN is still her PCP. Nothing further was needed.

## 2015-04-01 ENCOUNTER — Encounter: Payer: Self-pay | Admitting: *Deleted

## 2015-04-22 ENCOUNTER — Ambulatory Visit (INDEPENDENT_AMBULATORY_CARE_PROVIDER_SITE_OTHER): Payer: BLUE CROSS/BLUE SHIELD | Admitting: Gynecology

## 2015-04-22 ENCOUNTER — Encounter: Payer: Self-pay | Admitting: Gynecology

## 2015-04-22 VITALS — BP 124/76 | Ht 64.0 in | Wt 170.0 lb

## 2015-04-22 DIAGNOSIS — Z01419 Encounter for gynecological examination (general) (routine) without abnormal findings: Secondary | ICD-10-CM | POA: Diagnosis not present

## 2015-04-22 DIAGNOSIS — Z79890 Hormone replacement therapy (postmenopausal): Secondary | ICD-10-CM | POA: Diagnosis not present

## 2015-04-22 MED ORDER — ESTRADIOL 0.5 MG PO TABS: 0.5000 mg | ORAL_TABLET | Freq: Every day | ORAL | Status: DC

## 2015-04-22 NOTE — Patient Instructions (Signed)

## 2015-04-22 NOTE — Progress Notes (Signed)
Shirley Carroll 03/15/1955 [id number]        60 y.o.  G2P1011  for annual exam.  Doing well.  Past medical history,surgical history, problem list, medications, allergies, family history and social history were all reviewed and documented as reviewed in the EPIC chart.  ROS:  Performed with pertinent positives and negatives included in the history, assessment and plan.   Additional significant findings :  none   Exam: Kennon Portela assistant Filed Vitals:   04/22/15 1500  BP: 124/76  Height:  (1.626 m)  Weight: 170 lb (77.111 kg)   General appearance:  Normal affect, orientation and appearance. Skin: Grossly normal HEENT: Without gross lesions.  No cervical or supraclavicular adenopathy. Thyroid normal.  Lungs:  Clear without wheezing, rales or rhonchi Cardiac: RR, without RMG Abdominal:  Soft, nontender, without masses, guarding, rebound, organomegaly or hernia Breasts:  Examined lying and sitting without masses, retractions, discharge or axillary adenopathy. Pelvic:  Ext/BUS/vagina with atrophic changes  Adnexa without masses or tenderness    Anus and perineum normal   Rectovaginal normal sphincter tone without palpated masses or tenderness.    Assessment/Plan:  60 y.o. G73P1011 female for annual exam.   1. Postmenopausal/HRT. Status post [facility] 1991 for CIS of the cervix. Currently on estradiol 0.5 mg.  Tried to stop but had unacceptable hot flashes. I again reviewed the issue of HRT and risks include stroke heart attack DVT and breast cancer. She does have a history of A. Fib but has a regular rate on metoprolol baby aspirin. No anticoagulants. Patient wants to continue her HRT for now I refilled her 1 year. 2. Mammography 06/2014. Continue with annual mammography when due. S/P of the reviewed. 3. Pap smear 2015.  No Pap smear done today. History of CIS previously with normal Pap smears since.  Plan repeat Pap smear at 3 year interval. 4. Colonoscopy 2008 with reported repeat  interval 10 years. 5. DEXA 2011 normal. As she has been on HRT will plan repeat in another several years. Increased calcium vitamin D reviewed. 6. Health maintenance. No routine lab work done as patient reports this done elsewhere. Follow up 1 year, sooner as needed.   Dara Lords MD, 3:25 PM 04/22/2015

## 2015-05-22 ENCOUNTER — Telehealth: Payer: Self-pay | Admitting: *Deleted

## 2015-05-22 NOTE — Telephone Encounter (Signed)
Pt called c/o right breast nipple discharge x 2 weeks now, pt said today she had stabbing, burning sensation in the right breast, transferred to appointment desk to schedule.

## 2015-05-25 ENCOUNTER — Ambulatory Visit (INDEPENDENT_AMBULATORY_CARE_PROVIDER_SITE_OTHER): Payer: BLUE CROSS/BLUE SHIELD | Admitting: Gynecology

## 2015-05-25 ENCOUNTER — Encounter: Payer: Self-pay | Admitting: Gynecology

## 2015-05-25 ENCOUNTER — Telehealth: Payer: Self-pay | Admitting: *Deleted

## 2015-05-25 VITALS — BP 120/70

## 2015-05-25 DIAGNOSIS — N643 Galactorrhea not associated with childbirth: Secondary | ICD-10-CM

## 2015-05-25 LAB — PROLACTIN: Prolactin: 6.1 ng/mL

## 2015-05-25 LAB — TSH: TSH: 1.12 m[IU]/L

## 2015-05-25 NOTE — Progress Notes (Signed)
    Katharine LookDianne M Pelzer 10/11/1955 [id number]        60 y.o.  G2P1011 Presents with 1 month history of right breast galactorrhea. Clear in nature. Never bloody. Some tingling feeling associated with this. No abnormalities on self breast exam. Mammography coming due in April.  Past medical history,surgical history, problem list, medications, allergies, family history and social history were all reviewed and documented in the EPIC chart.  Directed ROS with pertinent positives and negatives documented in the history of present illness/assessment and plan.  Exam: Kennon PortelaKim Gardner assistant Filed Vitals:   05/25/15 0921  BP: 120/70   General appearance:  Normal Both breast examined lying and sitting without masses, retractions, discharge or adenopathy.  Assessment/Plan:  60 y.o. G2P1011 with history of clear galactorrhea in the right breast. Unable to elicit today. Check baseline prolactin/TSH. Schedule diagnostic mammography. Assuming blood work and mammography negative the patient will monitor for now. It ever bloody she knows to follow up.    Dara LordsFONTAINE,Keaundra Stehle P MD, 9:44 AM 05/25/2015

## 2015-05-25 NOTE — Telephone Encounter (Signed)
-----   Message from Dara Lordsimothy P Fontaine, MD sent at 05/25/2015 10:56 AM EDT ----- Schedule diagnostic mammography reference new onset right breast galactorrhea

## 2015-05-25 NOTE — Telephone Encounter (Signed)
Appointment on 06/02/15 @  9:15am as [facility],order faxed, pt aware.

## 2015-05-25 NOTE — Patient Instructions (Signed)
Mammography office will call you to schedule the mammogram. If you do not hear from them within the next week or so call our office.

## 2015-06-04 ENCOUNTER — Encounter: Payer: Self-pay | Admitting: Gynecology

## 2015-09-21 ENCOUNTER — Ambulatory Visit (INDEPENDENT_AMBULATORY_CARE_PROVIDER_SITE_OTHER): Payer: BLUE CROSS/BLUE SHIELD | Admitting: Urgent Care

## 2015-09-21 ENCOUNTER — Telehealth: Payer: Self-pay | Admitting: Pulmonary Disease

## 2015-09-21 VITALS — BP 122/80 | HR 70 | Temp 98.4°F | Resp 18 | Ht 64.0 in | Wt 167.0 lb

## 2015-09-21 DIAGNOSIS — R42 Dizziness and giddiness: Secondary | ICD-10-CM | POA: Diagnosis not present

## 2015-09-21 DIAGNOSIS — H8112 Benign paroxysmal vertigo, left ear: Secondary | ICD-10-CM | POA: Diagnosis not present

## 2015-09-21 DIAGNOSIS — R0789 Other chest pain: Secondary | ICD-10-CM

## 2015-09-21 DIAGNOSIS — B029 Zoster without complications: Secondary | ICD-10-CM

## 2015-09-21 DIAGNOSIS — R110 Nausea: Secondary | ICD-10-CM | POA: Diagnosis not present

## 2015-09-21 DIAGNOSIS — K589 Irritable bowel syndrome without diarrhea: Secondary | ICD-10-CM

## 2015-09-21 MED ORDER — MECLIZINE HCL 12.5 MG PO TABS: 12.5000 mg | ORAL_TABLET | Freq: Three times a day (TID) | ORAL | Status: DC | PRN

## 2015-09-21 NOTE — Patient Instructions (Addendum)
- You may use 1-2 tablets of meclizine 3 times a day as needed.   Benign Positional Vertigo Vertigo is the feeling that you or your surroundings are moving when they are not. Benign positional vertigo is the most common form of vertigo. The cause of this condition is not serious (is benign). This condition is triggered by certain movements and positions (is positional). This condition can be dangerous if it occurs while you are doing something that could endanger you or others, such as driving.  CAUSES In many cases, the cause of this condition is not known. It may be caused by a disturbance in an area of the inner ear that helps your brain to sense movement and balance. This disturbance can be caused by a viral infection (labyrinthitis), head injury, or repetitive motion. RISK FACTORS This condition is more likely to develop in: 1. Women. 2. People who are 61 years of age or older. SYMPTOMS Symptoms of this condition usually happen when you move your head or your eyes in different directions. Symptoms may start suddenly, and they usually last for less than a minute. Symptoms may include:  Loss of balance and falling.  Feeling like you are spinning or moving.  Feeling like your surroundings are spinning or moving.  Nausea and vomiting.  Blurred vision.  Dizziness.  Involuntary eye movement (nystagmus). Symptoms can be mild and cause only slight annoyance, or they can be severe and interfere with daily life. Episodes of benign positional vertigo may return (recur) over time, and they may be triggered by certain movements. Symptoms may improve over time. DIAGNOSIS This condition is usually diagnosed by medical history and a physical exam of the head, neck, and ears. You may be referred to a health care provider who specializes in ear, nose, and throat (ENT) problems (otolaryngologist) or a provider who specializes in disorders of the nervous system (neurologist). You may have additional  testing, including:  MRI.  A CT scan.  Eye movement tests. Your health care provider may ask you to change positions quickly while he or she watches you for symptoms of benign positional vertigo, such as nystagmus. Eye movement may be tested with an electronystagmogram (ENG), caloric stimulation, the Dix-Hallpike test, or the roll test.  An electroencephalogram (EEG). This records electrical activity in your brain.  Hearing tests. TREATMENT Usually, your health care provider will treat this by moving your head in specific positions to adjust your inner ear back to normal. Surgery may be needed in severe cases, but this is rare. In some cases, benign positional vertigo may resolve on its own in 2-4 weeks. HOME CARE INSTRUCTIONS Safety  Move slowly.Avoid sudden body or head movements.  Avoid driving.  Avoid operating heavy machinery.  Avoid doing any tasks that would be dangerous to you or others if a vertigo episode would occur.  If you have trouble walking or keeping your balance, try using a cane for stability. If you feel dizzy or unstable, sit down right away.  Return to your normal activities as told by your health care provider. Ask your health care provider what activities are safe for you. General Instructions  Take over-the-counter and prescription medicines only as told by your health care provider.  Avoid certain positions or movements as told by your health care provider.  Drink enough fluid to keep your urine clear or pale yellow.  Keep all follow-up visits as told by your health care provider. This is important. SEEK MEDICAL CARE IF:  You have a  fever.  Your condition gets worse or you develop new symptoms.  Your family or friends notice any behavioral changes.  Your nausea or vomiting gets worse.  You have numbness or a "pins and needles" sensation. SEEK IMMEDIATE MEDICAL CARE IF:  You have difficulty speaking or moving.  You are always dizzy.  You  faint.  You develop severe headaches.  You have weakness in your legs or arms.  You have changes in your hearing or vision.  You develop a stiff neck.  You develop sensitivity to light.   This information is not intended to replace advice given to you by your health care provider. Make sure you discuss any questions you have with your health care provider.   Document Released: 12/06/2005 Document Revised: 11/19/2014 Document Reviewed: 04/11/201   Epley Maneuver Self-Care WHAT IS THE EPLEY MANEUVER? The Epley maneuver is an exercise you can do to relieve symptoms of benign paroxysmal positional vertigo (BPPV). This condition is often just referred to as vertigo. BPPV is caused by the movement of tiny crystals (canaliths) inside your inner ear. The accumulation and movement of canaliths in your inner ear causes a sudden spinning sensation (vertigo) when you move your head to certain positions. Vertigo usually lasts about 30 seconds. BPPV usually occurs in just one ear. If you get vertigo when you lie on your left side, you probably have BPPV in your left ear. Your health care provider can tell you which ear is involved.  BPPV may be caused by a head injury. Many people older than 50 get BPPV for unknown reasons. If you have been diagnosed with BPPV, your health care provider may teach you how to do this maneuver. BPPV is not life threatening (benign) and usually goes away in time.  WHEN SHOULD I PERFORM THE EPLEY MANEUVER? You can do this maneuver at home whenever you have symptoms of vertigo. You may do the Epley maneuver up to 3 times a day until your symptoms of vertigo go away. HOW SHOULD I DO THE EPLEY MANEUVER? 3. Sit on the edge of a bed or table with your back straight. Your legs should be extended or hanging over the edge of the bed or table.  4. Turn your head halfway toward the affected ear.  5. Lie backward quickly with your head turned until you are lying flat on your back. You  may want to position a pillow under your shoulders.  6. Hold this position for 30 seconds. You may experience an attack of vertigo. This is normal. Hold this position until the vertigo stops. 7. Then turn your head to the opposite direction until your unaffected ear is facing the floor.  8. Hold this position for 30 seconds. You may experience an attack of vertigo. This is normal. Hold this position until the vertigo stops. 9. Now turn your whole body to the same side as your head. Hold for another 30 seconds.  10. You can then sit back up. ARE THERE RISKS TO THIS MANEUVER? In some cases, you may have other symptoms (such as changes in your vision, weakness, or numbness). If you have these symptoms, stop doing the maneuver and call your health care provider. Even if doing these maneuvers relieves your vertigo, you may still have dizziness. Dizziness is the sensation of light-headedness but without the sensation of movement. Even though the Epley maneuver may relieve your vertigo, it is possible that your symptoms will return within 5 years. WHAT SHOULD I DO AFTER THIS MANEUVER?  After doing the Epley maneuver, you can return to your normal activities. Ask your doctor if there is anything you should do at home to prevent vertigo. This may include:  Sleeping with two or more pillows to keep your head elevated.  Not sleeping on the side of your affected ear.  Getting up slowly from bed.  Avoiding sudden movements during the day.  Avoiding extreme head movement, like looking up or bending over.  Wearing a cervical collar to prevent sudden head movements. WHAT SHOULD I DO IF MY SYMPTOMS GET WORSE? Call your health care provider if your vertigo gets worse. Call your provider right way if you have other symptoms, including:   Nausea.  Vomiting.  Headache.  Weakness.  Numbness.  Vision changes.   This information is not intended to replace advice given to you by your health care  provider. Make sure you discuss any questions you have with your health care provider.   Document Released: 03/05/2013 Document Reviewed: 03/05/2013 Elsevier Interactive Patient Education 2016 [employer]. 6 Elsevier Interactive Patient Education 2016 [employer].    Meclizine tablets or capsules What is this medicine? MECLIZINE (MEK li zeen) is an antihistamine. It is used to prevent nausea, vomiting, or dizziness caused by motion sickness. It is also used to prevent and treat vertigo (extreme dizziness or a feeling that you or your surroundings are tilting or spinning around). This medicine may be used for other purposes; ask your health care provider or pharmacist if you have questions. What should I tell my health care provider before I take this medicine? They need to know if you have any of these conditions: -glaucoma -lung or breathing disease, like asthma -problems urinating -prostate disease -stomach or intestine problems -an unusual or allergic reaction to meclizine, other medicines, foods, dyes, or preservatives -pregnant or trying to get pregnant -breast-feeding How should I use this medicine? Take this medicine by mouth with a glass of water. Follow the directions on the prescription label. If you are using this medicine to prevent motion sickness, take the dose at least 1 hour before travel. If it upsets your stomach, take it with food or milk. Take your doses at regular intervals. Do not take your medicine more often than directed. Talk to your pediatrician regarding the use of this medicine in children. Special care may be needed. Overdosage: If you think you have taken too much of this medicine contact a poison control center or emergency room at once. NOTE: This medicine is only for you. Do not share this medicine with others. What if I miss a dose? If you miss a dose, take it as soon as you can. If it is almost time for your next dose, take only that dose. Do not  take double or extra doses. What may interact with this medicine? Do not take this medicine with any of the following medications: -MAOIs like Carbex, Eldepryl, Marplan, Nardil, and Parnate This medicine may also interact with the following medications: -alcohol -antihistamines for allergy, cough and cold -certain medicines for anxiety or sleep -certain medicines for depression, like amitriptyline, fluoxetine, sertraline -certain medicines for seizures like phenobarbital, primidone -general anesthetics like halothane, isoflurane, methoxyflurane, propofol -local anesthetics like lidocaine, pramoxine, tetracaine -medicines that relax muscles for surgery -narcotic medicines for pain -phenothiazines like chlorpromazine, mesoridazine, prochlorperazine, thioridazine This list may not describe all possible interactions. Give your health care provider a list of all the medicines, herbs, non-prescription drugs, or dietary supplements you use. Also tell them if  you smoke, drink alcohol, or use illegal drugs. Some items may interact with your medicine. What should I watch for while using this medicine? Tell your doctor or healthcare professional if your symptoms do not start to get better or if they get worse. You may get drowsy or dizzy. Do not drive, use machinery, or do anything that needs mental alertness until you know how this medicine affects you. Do not stand or sit up quickly, especially if you are an older patient. This reduces the risk of dizzy or fainting spells. Alcohol may interfere with the effect of this medicine. Avoid alcoholic drinks. Your mouth may get dry. Chewing sugarless gum or sucking hard candy, and drinking plenty of water may help. Contact your doctor if the problem does not go away or is severe. This medicine may cause dry eyes and blurred vision. If you wear contact lenses you may feel some discomfort. Lubricating drops may help. See your eye doctor if the problem does not go  away or is severe. What side effects may I notice from receiving this medicine? Side effects that you should report to your doctor or health care professional as soon as possible: -feeling faint or lightheaded, falls -fast, irregular heartbeat Side effects that usually do not require medical attention (report these to your doctor or health care professional if they continue or are bothersome): -constipation -headache -trouble passing urine or change in the amount of urine -trouble sleeping -upset stomach This list may not describe all possible side effects. Call your doctor for medical advice about side effects. You may report side effects to FDA at 1-800-FDA-1088. Where should I keep my medicine? Keep out of the reach of children. Store at room temperature between 15 and 30 degrees C (59 and 86 degrees F). Keep container tightly closed. Throw away any unused medicine after the expiration date. NOTE: This sheet is a summary. It may not cover all possible information. If you have questions about this medicine, talk to your doctor, pharmacist, or health care provider.    2016, Elsevier/Gold Standard. (2014-09-04 09:20:57)     IF you received an x-ray today, you will receive an invoice from [facility]. Please contact [facility] at [phone] with questions or concerns regarding your invoice.   IF you received labwork today, you will receive an invoice from [employer]. Please contact [facility] at [phone] with questions or concerns regarding your invoice.   Our billing staff will not be able to assist you with questions regarding bills from these companies.  You will be contacted with the lab results as soon as they are available. The fastest way to get your results is to activate your My Chart account. Instructions are located on the last page of this paperwork. If you have not heard from [place] regarding the results in 2 weeks, please contact  this office.

## 2015-09-21 NOTE — Progress Notes (Addendum)
    MRN: [id number] DOB: [birth year]  Subjective:   Shirley Carroll is a 60 y.o. female presenting for chief complaint of Dizziness  Reports 1 week history of dizziness and vertigo with associated nausea without vomiting. She reports a spinning room sensation when she lays on her left side or moves suddenly. Also has post-nasal drainage. Has had difficulty with vertigo in the past, resolved with prescribed medication. Has not tried any medications for this episode. Denies fever, sinus congestion, sinus pain, ear pain, ear drainage, sore throat, cough, heart racing, palpitations. Has a history of atrial fibrillation, managed with aspirin, metoprolol. Denies smoking cigarettes. Hydrates well. Drinks 1 cup of coffee per day.   Shirley Carroll has a current medication list which includes the following prescription(s): aspirin, aspirin-acetaminophen-caffeine, estradiol, ibuprofen, and metoprolol succinate. Also is allergic to propoxyphene hcl.  Shirley Carroll  has a past medical history of Osteoarthritis; Vertigo; Herniated disc; Renal calculus; History of headache; Colitis; and Atrial fibrillation (HCC). Also  has past surgical history that includes Back surgery (2004); Shoulder surgery; Gynecologic cryosurgery (1982); and Vaginal hysterectomy (1991).  Her family history includes Atrial fibrillation in her mother; Breast cancer in her maternal aunt; Breast cancer (age of onset: 3835) in her maternal aunt.   Objective:   Vitals: BP 122/80 mmHg  Pulse 70  Temp(Src) 98.4 F (36.9 C) (Oral)  Resp 18  Ht 5\' 4"  (1.626 m)  Wt 167 lb (75.751 kg)  BMI 28.65 kg/m2  SpO2 99%  Physical Exam  Constitutional: She is oriented to person, place, and time. She appears well-developed and well-nourished.  HENT:  TM's intact bilaterally, no effusions or erythema. Nasal turbinates pink and moist, nasal passages patent. No sinus tenderness. Oropharynx clear, mucous membranes moist, dentition in good repair.  Eyes: EOM are normal.  Pupils are equal, round, and reactive to light. No scleral icterus.  Neck: Normal range of motion. Neck supple.  Cardiovascular: Normal rate, regular rhythm and intact distal pulses.  Exam reveals no gallop and no friction rub.   No murmur heard. Pulmonary/Chest: No respiratory distress. She has no wheezes. She has no rales.  Lymphadenopathy:    She has no cervical adenopathy.  Neurological: She is alert and oriented to person, place, and time.  Dix-Hallpike test was negative.  Skin: Skin is warm and dry.   Assessment and Plan :     Wallis BambergMario Jai Bear, PA-C [facility] [phone] 09/21/2015 5:10 PM

## 2015-09-21 NOTE — Telephone Encounter (Signed)
Called spoke with patient and informed her that we will be happy to check with SN about re-establishing for PCP but because he is no longer 'listed' as a PCP we cannot guarantee that her insurance will cover the visit.  Offered to send a referral or check with SN to see if we can see her for her inner ear problem and then refer her to a PCP.  Pt declined these services and stated that she would go to an UC.  She would like a referral for primary care.  This order has been placed Nothing further needed; will sign off.

## 2015-10-22 ENCOUNTER — Ambulatory Visit: Payer: BLUE CROSS/BLUE SHIELD | Admitting: Family

## 2016-01-01 ENCOUNTER — Other Ambulatory Visit: Payer: Self-pay | Admitting: Cardiovascular Disease

## 2016-01-01 NOTE — Telephone Encounter (Signed)
Rx(s) sent to pharmacy electronically.  

## 2016-03-12 ENCOUNTER — Other Ambulatory Visit: Payer: Self-pay | Admitting: Cardiovascular Disease

## 2016-03-15 NOTE — Telephone Encounter (Signed)
Rx has been sent to the pharmacy electronically. ° °

## 2016-04-12 ENCOUNTER — Other Ambulatory Visit: Payer: Self-pay | Admitting: Cardiovascular Disease

## 2016-04-12 NOTE — Telephone Encounter (Signed)
Rx(s) sent to pharmacy electronically.  

## 2016-04-17 IMAGING — CR DG CHEST 2V
2 series · 2 of 2 positions shown · non-contrast
Comparison: Chest radiograph April 09, 2009

CLINICAL DATA: Intermittent shortness of breath and chest pressure
with tightness for 2 days.

EXAM:
CHEST  2 VIEW

[chest pa]
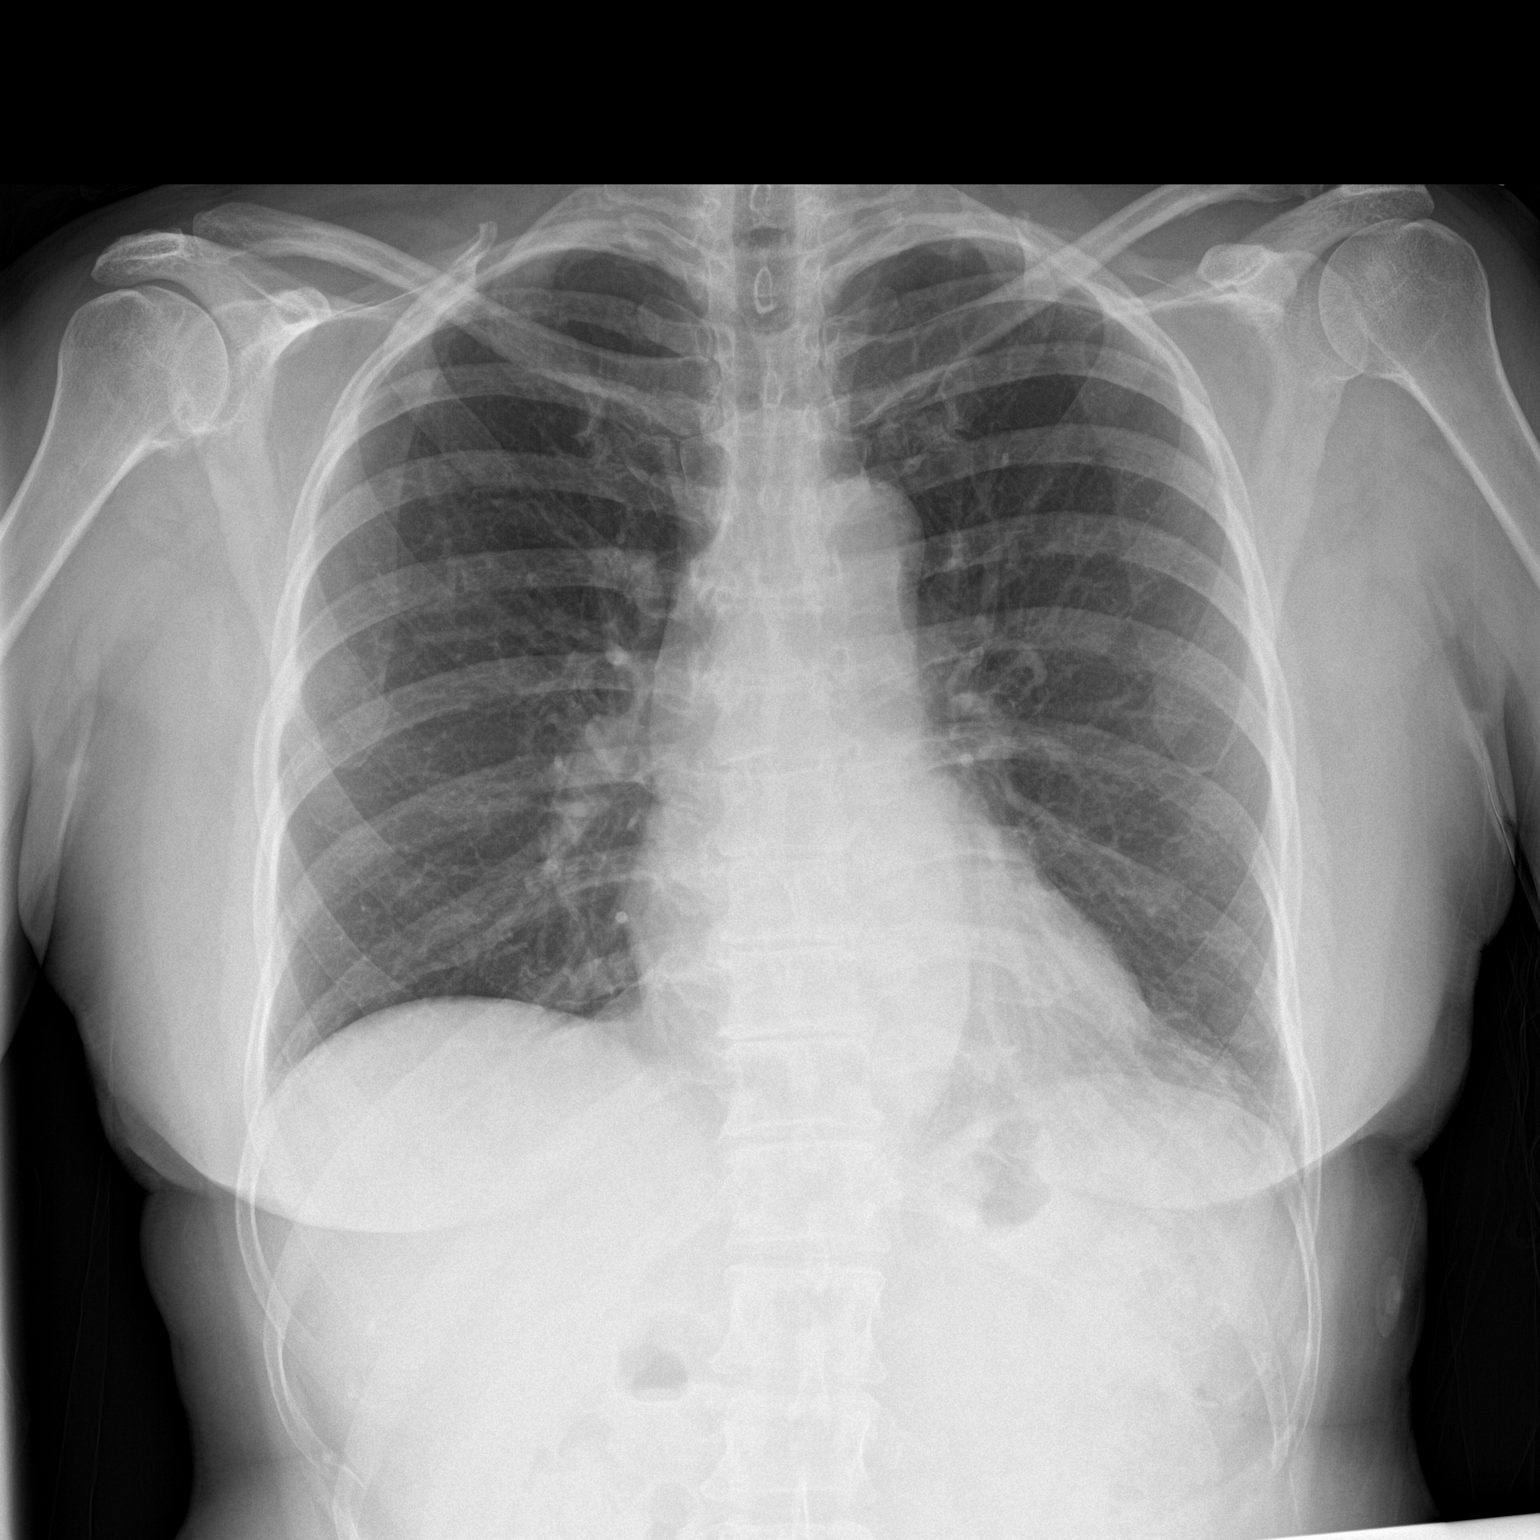

[chest lat]
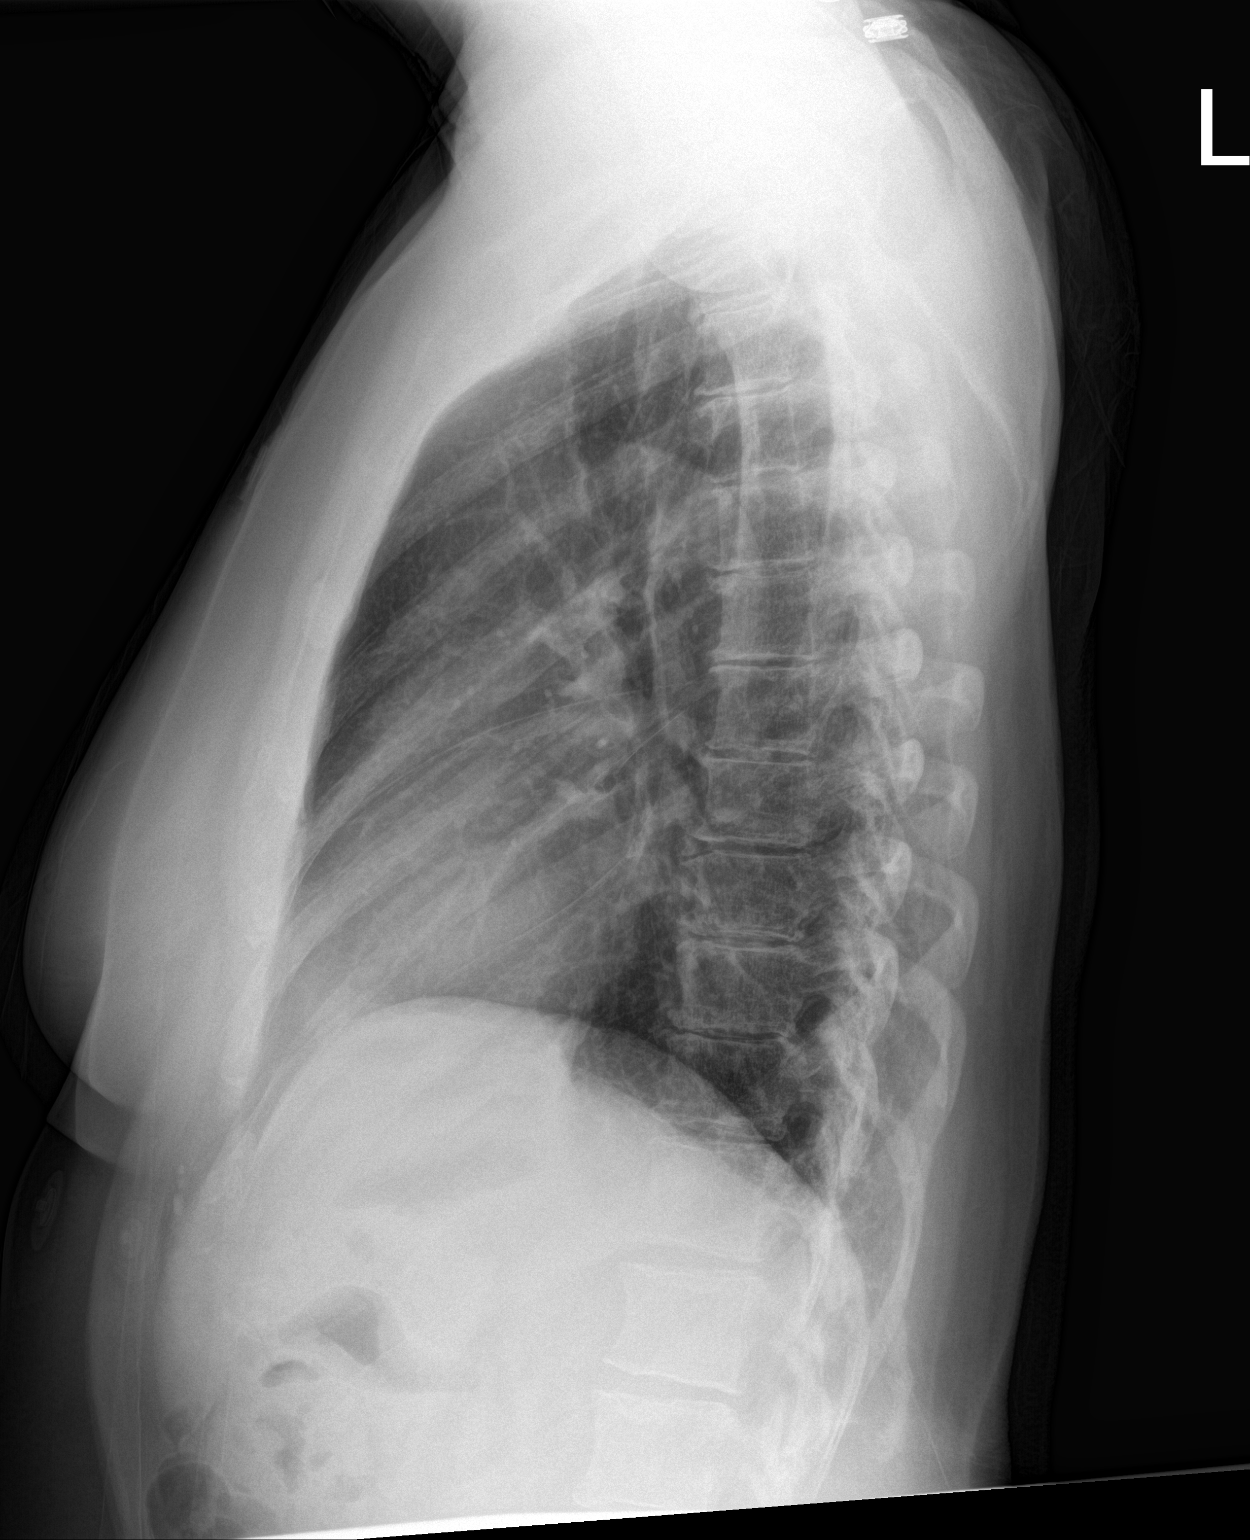

[2 of 2 positions shown; findings below may reference images not displayed]

FINDINGS: Cardiomediastinal silhouette is unremarkable. Lingular atelectasis/
scarring. The lungs are otherwise clear without pleural effusions or
focal consolidations. Trachea projects midline and there is no
pneumothorax. Soft tissue planes and included osseous structures are
non-suspicious. Mild degenerative change of the thoracic spine.
IMPRESSION: No active cardiopulmonary disease.

## 2016-05-19 ENCOUNTER — Ambulatory Visit: Payer: BLUE CROSS/BLUE SHIELD | Admitting: Cardiovascular Disease

## 2016-05-20 ENCOUNTER — Other Ambulatory Visit: Payer: Self-pay | Admitting: Gynecology

## 2016-07-04 ENCOUNTER — Encounter: Payer: Self-pay | Admitting: Gynecology

## 2016-07-12 ENCOUNTER — Other Ambulatory Visit: Payer: Self-pay | Admitting: Gynecology

## 2016-08-16 ENCOUNTER — Other Ambulatory Visit: Payer: Self-pay | Admitting: Gynecology

## 2016-08-19 ENCOUNTER — Encounter: Payer: Self-pay | Admitting: Gynecology

## 2016-08-19 ENCOUNTER — Ambulatory Visit (INDEPENDENT_AMBULATORY_CARE_PROVIDER_SITE_OTHER): Payer: BLUE CROSS/BLUE SHIELD | Admitting: Gynecology

## 2016-08-19 VITALS — BP 120/76 | Ht 64.0 in | Wt 166.0 lb

## 2016-08-19 DIAGNOSIS — Z01411 Encounter for gynecological examination (general) (routine) with abnormal findings: Secondary | ICD-10-CM

## 2016-08-19 DIAGNOSIS — E28319 Asymptomatic premature menopause: Secondary | ICD-10-CM

## 2016-08-19 DIAGNOSIS — Z79890 Hormone replacement therapy (postmenopausal): Secondary | ICD-10-CM | POA: Diagnosis not present

## 2016-08-19 DIAGNOSIS — N952 Postmenopausal atrophic vaginitis: Secondary | ICD-10-CM | POA: Diagnosis not present

## 2016-08-19 MED ORDER — ESTRADIOL 0.5 MG PO TABS: 0.5000 mg | ORAL_TABLET | Freq: Every day | ORAL | 12 refills | Status: DC

## 2016-08-19 NOTE — Patient Instructions (Signed)
Schedule your colonoscopy

## 2016-08-19 NOTE — Addendum Note (Signed)
Addended by: Feliciana RossettiGARDNER, Charelle Petrakis on: 08/19/2016 04:33 PM   Modules accepted: Orders

## 2016-08-19 NOTE — Progress Notes (Signed)
    Shirley Carroll 06/03/1955 [id number]        61 y.o.  G2P1011 for annual exam.  Doing well without gynecologic complaints.  Past medical history,surgical history, problem list, medications, allergies, family history and social history were all reviewed and documented as reviewed in the EPIC chart.  ROS:  Performed with pertinent positives and negatives included in the history, assessment and plan.   Additional significant findings :  None   Exam: Kennon PortelaKim Gardner assistant Vitals:   08/19/16 1603  BP: 120/76  Weight: 166 lb (75.3 kg)  Height: 5\' 4"  (1.626 m)   Body mass index is 28.49 kg/m.  General appearance:  Normal affect, orientation and appearance. Skin: Grossly normal HEENT: Without gross lesions.  No cervical or supraclavicular adenopathy. Thyroid normal.  Lungs:  Clear without wheezing, rales or rhonchi Cardiac: RR, without RMG Abdominal:  Soft, nontender, without masses, guarding, rebound, organomegaly or hernia Breasts:  Examined lying and sitting without masses, retractions, discharge or axillary adenopathy. Pelvic:  Ext, BUS, Vagina: With atrophic changes  Adnexa: Without masses or tenderness    Anus and perineum: Normal   Rectovaginal: Normal sphincter tone without palpated masses or tenderness.    Assessment/Plan:  61 y.o. 342P1011 female for annual exam.   1. Postmenopausal/atrophic genital changes. Continues on estradiol 0.5 mg daily. [facility] for CIS of the cervix. Tried stopping with unacceptable hot flushes. I reviewed the most current data on HRT to include the 2017 names guidelines. Benefits as far as symptom relief and possible cardiovascular and bone health with early initiation versus risks of stroke heart attack DVT and the breast cancer issue. Patient understands the risks and wants to continue. Refill 1 year provided. 2. Pap smear 2015. Pap smear done today. History of CIS previously with normal Pap smears afterwards. 3. Mammography  06/2016. Breast exam normal today. Continue with annual mammography when due. SBE monthly reviewed. 4. Colonoscopy 2008. Reminded patient she is due now she is going to call to arrange. 5. DEXA 2011 normal. Continues on HRT. Will plan repeat DEXA closer to age 61. 6. Health maintenance. Patient reports routine lab work done through her work. Declines blood work here. Follow up in one year, sooner as needed.   Dara LordsFONTAINE,Elanah Osmanovic P MD, 4:29 PM 08/19/2016

## 2016-08-23 LAB — PAP IG W/ RFLX HPV ASCU

## 2016-09-02 ENCOUNTER — Ambulatory Visit: Payer: Self-pay | Admitting: Cardiovascular Disease

## 2016-09-09 ENCOUNTER — Encounter: Payer: Self-pay | Admitting: Cardiovascular Disease

## 2016-09-09 ENCOUNTER — Ambulatory Visit (INDEPENDENT_AMBULATORY_CARE_PROVIDER_SITE_OTHER): Payer: Self-pay | Admitting: Cardiovascular Disease

## 2016-09-09 VITALS — BP 124/80 | HR 71 | Ht 64.0 in | Wt 168.2 lb

## 2016-09-09 DIAGNOSIS — R002 Palpitations: Secondary | ICD-10-CM

## 2016-09-09 DIAGNOSIS — I480 Paroxysmal atrial fibrillation: Secondary | ICD-10-CM

## 2016-09-09 DIAGNOSIS — Z13220 Encounter for screening for lipoid disorders: Secondary | ICD-10-CM

## 2016-09-09 DIAGNOSIS — Z79899 Other long term (current) drug therapy: Secondary | ICD-10-CM

## 2016-09-09 MED ORDER — METOPROLOL SUCCINATE ER 25 MG PO TB24: 25.0000 mg | ORAL_TABLET | Freq: Every day | ORAL | 11 refills | Status: DC

## 2016-09-09 NOTE — Patient Instructions (Addendum)
Your physician recommends that you return for lab work fasting here in our office.  You will receive your test results via mail, phone call, or by My chart if you have it.  Your physician has recommended you make the following change in your medication:   1.) the metoprolol succ has been increased from 1/2 tablet ( 12.5 mg) to 1 tablet daily. ( 25 mg) a new prescription will be sent to your pharmacy reflecting this change.   Your physician wants you to follow-up in: 1 year or sooner if needed. You will receive a reminder letter in the mail two months in advance. If you don't receive a letter, please call our office to schedule the follow-up appointment.  If you need a refill on your cardiac medications before your next appointment, please call your pharmacy.

## 2016-09-09 NOTE — Progress Notes (Signed)
Patient ID: AUBREANA CORNACCHIA, female   DOB: [birth year], 61 y.o.   MRN: [id number]     HPI: BABY GIEGER is a 61 y.o. female who presents for a 21 month follow-up evaluation.  Ms. Cheong presented to the emergency room on 08/10/2014 after a 2 day history of intermittent shortness of breath and chest pressure.  She was found to have atrial fibrillation on ECG  with a controlled ventricular rate at 98 bpm.  Laboratory was unremarkable including TSH, CBC, BMP, troponin and d-dimer.  She is negative.  Chest x-ray.  She was started on low-dose metoprolo  tartrate 12.5 mg twice a day with follow-up recommended.  She has a cha2ds2vasc score of 1 and anticoagulation therapy was not instituted.  She was seen by extenders in June 2016 and requested that I see her in follow-up for cardiology care since I take care of her husband and she presents for evaluation.  She underwent an echo Doppler study on 09/02/2014.  This revealed normal systolic function with grade 2 diastolic dysfunction.  Trivial mitral regurgitation.  There was very minimal increased.  PA pressure 32 mm.  When I saw her, she denied any he any recurrent episodes of palpitations.  She was walking daily and denied exertional symptomatology.   Since I last saw her almost 2 years ago, she has done well.  She experiences occasional [place] dictations which are intermittent and may occur 1 time per month, but may last up to 30 minutes.  He denies any exertional symptomatology.  There is no chest pain with activity or shortness of breath.  At times she notes rare chest pain when she is sitting down after she has done some outside work.  She has not had recent laboratory.  She denies significant caffeine intake.  There is no use of pseudoephedrine preparations.  She has been taking Toprol-XL 12.5 mg at bedtime. She presents for cardiology follow up evaluation.  Past Medical History:  Diagnosis Date  . Atrial fibrillation ([place])   . Colitis   . Herniated  disc   . History of headache   . Osteoarthritis   . Renal calculus   . Vertigo     Past Surgical History:  Procedure Laterality Date  . BACK SURGERY  2004  . GYNECOLOGIC CRYOSURGERY  1982  . SHOULDER SURGERY    . VAGINAL HYSTERECTOMY  1991   CIS of the cervix    Allergies  Allergen Reactions  . Propoxyphene Hcl     REACTION: extreme nausea  Darvon    Current Outpatient Prescriptions  Medication Sig Dispense Refill  . aspirin 81 MG chewable tablet Chew 1 tablet (81 mg total) by mouth daily. 30 tablet 0  . aspirin-acetaminophen-caffeine (EXCEDRIN MIGRAINE) 924-268-34 MG per tablet Take 2 tablets by mouth every 6 (six) hours as needed for headache.    . estradiol (ESTRACE) 0.5 MG tablet Take 1 tablet (0.5 mg total) by mouth daily. 30 tablet 12  . ibuprofen (ADVIL,MOTRIN) 200 MG tablet Take 400 mg by mouth every 6 (six) hours as needed for moderate pain.     . meclizine (ANTIVERT) 12.5 MG tablet Take 1 tablet (12.5 mg total) by mouth 3 (three) times daily as needed for dizziness. 60 tablet 6  . metoprolol succinate (TOPROL-XL) 25 MG 24 hr tablet Take 1 tablet (25 mg total) by mouth daily after breakfast. 30 tablet 11   No current facility-administered medications for this visit.     Social History   Social History  .  Marital status: Married    Spouse name: Photographer  . Number of children: N/A  . Years of education: N/A   Occupational History  .  [employer] History Main Topics  . Smoking status: Never Smoker  . Smokeless tobacco: Never Used  . Alcohol use 1.8 oz/week    3 Standard drinks or equivalent per week  . Drug use: No  . Sexual activity: Yes    Birth control/ protection: Surgical, Implant     Comment: 1st intercourse 61 yo-More than 5 partners   Other Topics Concern  . Not on file   Social History Narrative  . 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  She is married for 35 years.  She  has 1 daughter age 37 and 2 grandchildren.  Family History  Problem Relation Age of Onset  . Atrial fibrillation Mother   . Breast cancer Maternal Aunt        mid 69's  . Breast cancer Maternal Aunt 66   Family history is notable that mother has a history of atrial fibrillation.  Father has heart disease.  She does not have any siblings.  ROS General: Negative; No fevers, chills, or night sweats HEENT: Negative; No changes in vision or hearing, sinus congestion, difficulty swallowing Pulmonary: Negative; No cough, wheezing, shortness of breath, hemoptysis Cardiovascular: See HPI: No chest pain, presyncope, syncope, palpatations GI: Negative; No nausea, vomiting, diarrhea, or abdominal pain GU: Negative; No dysuria, hematuria, or difficulty voiding Musculoskeletal: Negative; no myalgias, joint pain, or weakness Hematologic: Negative; no easy bruising, bleeding Endocrine: Negative; no heat/cold intolerance; no diabetes, Neuro: Negative; no changes in balance, headaches Skin: Negative; No rashes or skin lesions Psychiatric: Negative; No behavioral problems, depression Sleep: Negative; No snoring,  daytime sleepiness, hypersomnolence, bruxism, restless legs, hypnogognic hallucinations. Other comprehensive 14 point system review is negative   Physical Exam BP 124/80   Pulse 71   Ht '5\' 4"'  (1.626 m)   Wt 168 lb 3.2 oz (76.3 kg)   BMI 28.87 kg/m    Repeat blood pressure by me 120/72  Wt Readings from Last 3 Encounters:  09/09/16 168 lb 3.2 oz (76.3 kg)  08/19/16 166 lb (75.3 kg)  09/21/15 167 lb (75.8 kg)   General: Alert, oriented, no distress.  Skin: normal turgor, no rashes, warm and dry HEENT: Normocephalic, atraumatic. Pupils equal round and reactive to light; sclera anicteric; extraocular muscles intact;  Nose without nasal septal hypertrophy Mouth/Parynx benign; Mallinpatti scale 3 Neck: No JVD, no carotid bruits; normal carotid upstroke Lungs: clear to ausculatation  and percussion; no wheezing or rales Chest wall: without tenderness to palpitation Heart: PMI not displaced, RRR, s1 s2 normal, 1/6 systolic murmur, no diastolic murmur, no rubs, gallops, thrills, or heaves Abdomen: soft, nontender; no hepatosplenomehaly, BS+; abdominal aorta nontender and not dilated by palpation. Back: no CVA tenderness Pulses 2+ Musculoskeletal: full range of motion, normal strength, no joint deformities Extremities: no clubbing cyanosis or edema, Homan's sign negative  Neurologic: grossly nonfocal; Cranial nerves grossly wnl Psychologic: Normal mood and affect; normal cognition   ECG (independently read by me): Normal sinus rhythm at 71 bpm.  No ST segment changes.  Normal intervals with QTc interval 428 ms.  September 2016 ECG (independently read by me): Normal sinus rhythm at 66 bpm.  No significant ST-T changes.  LABS:  BMP Latest Ref Rng & Units 12/08/2014 08/10/2014 11/21/2013  Glucose 65 - 99 mg/dL 100(H)  100(H) 92  BUN 7 - 25 mg/dL '16 18 18  ' Creatinine 0.50 - 0.99 mg/dL 0.81 0.75 0.88  Sodium 135 - 146 mmol/L 139 136 140  Potassium 3.5 - 5.3 mmol/L 4.8 4.0 4.4  Chloride 98 - 110 mmol/L 104 103 101  CO2 20 - 31 mmol/L '29 23 27  ' Calcium 8.6 - 10.4 mg/dL 9.4 9.6 9.7   Hepatic Function Latest Ref Rng & Units 12/08/2014 11/21/2013 08/15/2012  Total Protein 6.1 - 8.1 g/dL 6.2 6.8 6.3  Albumin 3.6 - 5.1 g/dL 3.9 4.4 4.1  AST 10 - 35 U/L '15 21 12  ' ALT 6 - 29 U/L '14 17 9  ' Alk Phosphatase 33 - 130 U/L 88 81 87  Total Bilirubin 0.2 - 1.2 mg/dL 0.4 0.3 0.3  Bilirubin, Direct 0.0 - 0.3 mg/dL - - -   CBC Latest Ref Rng & Units 08/10/2014 11/21/2013 08/15/2012  WBC 4.0 - 10.5 K/uL 6.2 5.9 5.6  Hemoglobin 12.0 - 15.0 g/dL 13.7 13.1 12.2  Hematocrit 36.0 - 46.0 % 40.3 37.9 36.1  Platelets 150 - 400 K/uL 231 283 268   Lab Results  Component Value Date   MCV 91.0 08/10/2014   MCV 90.0 11/21/2013   MCV 89.6 08/15/2012    Lab Results  Component Value Date   TSH 1.12  05/25/2015    BNP No results found for: BNP  ProBNP No results found for: PROBNP   Lipid Panel     Component Value Date/Time   CHOL 189 12/08/2014 0927   TRIG 112 12/08/2014 0927   HDL 62 12/08/2014 0927   CHOLHDL 3.0 12/08/2014 0927   VLDL 22 12/08/2014 0927   LDLCALC 105 12/08/2014 0927     RADIOLOGY: No results found.  IMPRESSION:  1. PAF (paroxysmal atrial fibrillation) (HCC)   2. Palpitations   3. Medication management   4. Screening, lipid     ASSESSMENT AND PLAN: Ms. Deveney Bayon is a 61 year old female who developed an episode of isolated paroxysmal atrial fibrillation in May 2016 which self resolved.  She was advised to take metoprolol, tartrate 12.5 mg twice a day, but had only been taking this 12.5 mg at bedtime.  When I saw her in follow-up, I change this to metoprolol succinate for more long acting therapy.  She has been stable for the past 2 years.  Recently, she has noticed perhaps slight lean more palpitations which occur approximately once a month and may last anywhere from 15-30 minutes.  She denies any runs of tachycardia.  She denies any episodes of presyncope or syncope.  There is no associated chest pain.  I have recommended she increase metoprolol to 25 mg but take this in the morning rather than at night.  I will check a complete set of fasting laboratory.  Her blood pressure today is stable on current therapy.  I discussed the importance of caffeine reduction.  As long as she is stable, I will see her in one year for reevaluation. [name], MD, [facility]  09/11/2016 2:24 PM

## 2017-07-04 DIAGNOSIS — Z1231 Encounter for screening mammogram for malignant neoplasm of breast: Secondary | ICD-10-CM | POA: Diagnosis not present

## 2017-09-07 DIAGNOSIS — M79671 Pain in right foot: Secondary | ICD-10-CM | POA: Insufficient documentation

## 2017-09-07 DIAGNOSIS — M19071 Primary osteoarthritis, right ankle and foot: Secondary | ICD-10-CM | POA: Diagnosis not present

## 2017-10-02 ENCOUNTER — Other Ambulatory Visit: Payer: Self-pay | Admitting: Cardiovascular Disease

## 2017-10-02 ENCOUNTER — Other Ambulatory Visit: Payer: Self-pay | Admitting: Gynecology

## 2017-10-08 ENCOUNTER — Other Ambulatory Visit: Payer: Self-pay | Admitting: Gynecology

## 2017-10-08 ENCOUNTER — Other Ambulatory Visit: Payer: Self-pay | Admitting: Cardiovascular Disease

## 2018-04-12 ENCOUNTER — Other Ambulatory Visit: Payer: Self-pay | Admitting: Cardiovascular Disease

## 2018-04-12 NOTE — Telephone Encounter (Signed)
Rx request sent to pharmacy.  

## 2018-07-10 ENCOUNTER — Other Ambulatory Visit: Payer: Self-pay | Admitting: Cardiovascular Disease

## 2018-07-10 NOTE — Telephone Encounter (Signed)
Metoprolol Succ 25 mg refilled. Notation made OV needed.

## 2018-07-18 ENCOUNTER — Other Ambulatory Visit: Payer: Self-pay | Admitting: Cardiovascular Disease

## 2018-07-18 MED ORDER — METOPROLOL SUCCINATE ER 25 MG PO TB24: 25.0000 mg | ORAL_TABLET | Freq: Every day | ORAL | 1 refills | Status: DC

## 2018-07-18 NOTE — Telephone Encounter (Signed)
New Message    *STAT* If patient is at the pharmacy, call can be transferred to refill team.   1. Which medications need to be refilled? (please list name of each medication and dose if known) metoprolol succinate (TOPROL-XL) 25 MG 24 hr tablet    2. Which pharmacy/location (including street and city if local pharmacy) is medication to be sent to? [id number] - [place], [place] - [address]  3. Do they need a 30 day or 90 day supply? 90 day insurance will only cover 90 day

## 2018-07-26 ENCOUNTER — Other Ambulatory Visit: Payer: Self-pay | Admitting: Cardiovascular Disease

## 2018-07-26 MED ORDER — METOPROLOL SUCCINATE ER 25 MG PO TB24: 25.0000 mg | ORAL_TABLET | Freq: Every day | ORAL | 0 refills | Status: DC

## 2018-07-26 NOTE — Telephone Encounter (Signed)
  Prescription was sent to wrong pharmacy and wrong length of time   *STAT* If patient is at the pharmacy, call can be transferred to refill team.   1. Which medications need to be refilled? (please list name of each medication and dose if known) metoprolol succinate (TOPROL-XL) 25 MG 24 hr tablet  2. Which pharmacy/location (including street and city if local pharmacy) is medication to be sent to? [facility]  3. Do they need a 30 day or 90 day supply? 90 days

## 2018-10-18 DIAGNOSIS — R42 Dizziness and giddiness: Secondary | ICD-10-CM | POA: Diagnosis not present

## 2018-10-18 DIAGNOSIS — H6983 Other specified disorders of Eustachian tube, bilateral: Secondary | ICD-10-CM | POA: Diagnosis not present

## 2018-10-19 ENCOUNTER — Ambulatory Visit: Payer: Self-pay | Admitting: Family Medicine

## 2018-10-23 ENCOUNTER — Other Ambulatory Visit: Payer: Self-pay | Admitting: Cardiovascular Disease

## 2018-11-06 ENCOUNTER — Other Ambulatory Visit: Payer: Self-pay | Admitting: Cardiovascular Disease

## 2018-11-07 ENCOUNTER — Other Ambulatory Visit: Payer: Self-pay | Admitting: Cardiovascular Disease

## 2018-11-28 ENCOUNTER — Telehealth: Payer: Self-pay | Admitting: Cardiovascular Disease

## 2018-12-10 DIAGNOSIS — J3489 Other specified disorders of nose and nasal sinuses: Secondary | ICD-10-CM | POA: Diagnosis not present

## 2018-12-10 DIAGNOSIS — R51 Headache: Secondary | ICD-10-CM | POA: Diagnosis not present

## 2018-12-10 DIAGNOSIS — M791 Myalgia, unspecified site: Secondary | ICD-10-CM | POA: Diagnosis not present

## 2018-12-11 ENCOUNTER — Encounter: Payer: Self-pay | Admitting: Gynecology

## 2018-12-21 ENCOUNTER — Telehealth: Payer: Self-pay

## 2018-12-21 NOTE — Telephone Encounter (Signed)
Copied from [place] [id number]. Topic: General - Inquiry >> Dec 21, 2018  3:31 PM Rutherford Nail, [place] wrote: Reason for CRM: Patient's husband calling. States that she is scheduled for a NP appointment  01/01/2019. States that she tested positive for COVID 12/10/2018. States that patient feels okay, she is just having some drainage in the back of her throat which is causing a cough. Patient would like to know if Dr Zigmund Daniel could order chest x-ray for her to get her lungs checked? Please advise.  CB#: [phone]

## 2018-12-24 DIAGNOSIS — Z20828 Contact with and (suspected) exposure to other viral communicable diseases: Secondary | ICD-10-CM | POA: Diagnosis not present

## 2018-12-25 NOTE — Telephone Encounter (Signed)
Pt stated she is feeling better and don't think she will need an xray.

## 2018-12-25 NOTE — Telephone Encounter (Signed)
Unless having shortness of breath or wheezing she probably doesn't need a chest xray which may expose her to [facility] radiation.  We can discuss further at her appt and decide based on if she continues to have symptoms or lung exam findings.

## 2019-01-01 ENCOUNTER — Ambulatory Visit: Payer: Self-pay | Admitting: Family Medicine

## 2019-01-08 DIAGNOSIS — Z20828 Contact with and (suspected) exposure to other viral communicable diseases: Secondary | ICD-10-CM | POA: Diagnosis not present

## 2019-01-17 ENCOUNTER — Ambulatory Visit: Payer: Self-pay | Admitting: Family Medicine

## 2019-01-21 ENCOUNTER — Ambulatory Visit (INDEPENDENT_AMBULATORY_CARE_PROVIDER_SITE_OTHER): Payer: BC Managed Care – PPO | Admitting: Family Medicine

## 2019-01-21 ENCOUNTER — Encounter: Payer: Self-pay | Admitting: Family Medicine

## 2019-01-21 ENCOUNTER — Other Ambulatory Visit: Payer: Self-pay

## 2019-01-21 VITALS — BP 120/70 | HR 74 | Temp 98.0°F | Ht 64.0 in | Wt 173.0 lb

## 2019-01-21 DIAGNOSIS — Z0000 Encounter for general adult medical examination without abnormal findings: Secondary | ICD-10-CM | POA: Insufficient documentation

## 2019-01-21 DIAGNOSIS — Z23 Encounter for immunization: Secondary | ICD-10-CM | POA: Diagnosis not present

## 2019-01-21 DIAGNOSIS — Z1211 Encounter for screening for malignant neoplasm of colon: Secondary | ICD-10-CM | POA: Diagnosis not present

## 2019-01-21 DIAGNOSIS — I480 Paroxysmal atrial fibrillation: Secondary | ICD-10-CM

## 2019-01-21 DIAGNOSIS — Z13220 Encounter for screening for lipoid disorders: Secondary | ICD-10-CM

## 2019-01-21 NOTE — Assessment & Plan Note (Signed)
Well adult Orders Placed This Encounter  Procedures  . Flu Vaccine QUAD 6+ mos PF IM (Fluarix Quad PF)  . Comp Met (CMET)    Standing Status:   Future    Standing Expiration Date:   01/21/2020  . CBC    Standing Status:   Future    Standing Expiration Date:   01/21/2020  . Lipid panel    Standing Status:   Future    Standing Expiration Date:   01/21/2020  . TSH    Standing Status:   Future    Standing Expiration Date:   01/21/2020  . Vitamin D (25 hydroxy)    Standing Status:   Future    Standing Expiration Date:   01/21/2020  . Ambulatory referral to Gastroenterology    Referral Priority:   Routine    Referral Type:   Consultation    Referral Reason:   Specialty Services Required    Number of Visits Requested:   1  Screening: Colon cancer, lipid.  She will f/u with Gyn for cervical cancer screening.  Immunizations: Flu vaccine Anticipatory guidance/Risk factor Reduction:  Recommendations per AVS

## 2019-01-21 NOTE — Assessment & Plan Note (Signed)
-  Stable with toprol-xl.  Followed by cardiology.

## 2019-01-21 NOTE — Progress Notes (Signed)
Shirley Carroll - 62 y.o. female MRN [id number]  Date of birth: 1955/05/07  Subjective Chief Complaint  Patient presents with  . Establish Care    pysical pt did not fast .. pt does have an obgyn but have not sch for any HM overdues.    HPI Shirley Carroll is a 63 y.o. female with history of PAF here today for initial visit and annual exam.  She continues to see a cardiologist ( Dr. Claiborne Billings) and Gyn (Dr. Phineas Real).  She is taking toprol xl for PAF.  She reports it has been >1 year since her last episode.  She is symptomatic when she has and episode.  She has been taking estradiol for vasomotor symptoms related to menopause but has almost weaned herself from this.  She plans on making appt with Dr. Phineas Real for her updated pap.  She has no new concerns today.  She has regular dental care.  She follows a healthy diet and walks for exercise.  She denies significant stressors.  She did have COVID in September but reports she was not too ill from this.    Review of Systems  Constitutional: Negative for chills, fever, malaise/fatigue and weight loss.  HENT: Negative for congestion, ear pain and sore throat.   Eyes: Negative for blurred vision, double vision and pain.  Respiratory: Negative for cough and shortness of breath.   Cardiovascular: Negative for chest pain and palpitations.  Gastrointestinal: Negative for abdominal pain, blood in stool, constipation, heartburn and nausea.  Genitourinary: Negative for dysuria and urgency.  Musculoskeletal: Negative for joint pain and myalgias.  Neurological: Negative for dizziness and headaches.  Endo/Heme/Allergies: Does not bruise/bleed easily.  Psychiatric/Behavioral: Negative for depression. The patient is not nervous/anxious and does not have insomnia.     Allergies  Allergen Reactions  . Propoxyphene Hcl     REACTION: extreme nausea  Darvon    Past Medical History:  Diagnosis Date  . Atrial fibrillation ([place])   . Colitis   . Herniated disc    . History of headache   . Osteoarthritis   . Renal calculus   . Vertigo     Past Surgical History:  Procedure Laterality Date  . BACK SURGERY  2004  . GYNECOLOGIC CRYOSURGERY  1982  . SHOULDER SURGERY    . VAGINAL HYSTERECTOMY  1991   CIS of the cervix    Social History   Socioeconomic History  . Marital status: Married    Spouse name: Photographer  . Number of children: Not on file  . Years of education: Not on file  . Highest education level: Not on file  Occupational History    Employer: [employer]  . Financial resource strain: Not on file  . Food insecurity    Worry: Not on file    Inability: Not on file  . Transportation needs    Medical: Not on file    Non-medical: Not on file  Tobacco Use  . Smoking status: Never Smoker  . Smokeless tobacco: Never Used  Substance and Sexual Activity  . Alcohol use: Yes    Alcohol/week: 3.0 standard drinks    Types: 3 Standard drinks or equivalent per week  . Drug use: No  . Sexual activity: Yes    Birth control/protection: Surgical, Implant    Comment: 1st intercourse 63 yo-More than 5 partners  Lifestyle  . Physical activity    Days per week: Not on file    Minutes per session: Not  on file  . Stress: Not on file  Relationships  . Social Herbalist on phone: Not on file    Gets together: Not on file    Attends religious service: Not on file    Active member of club or organization: Not on file    Attends meetings of clubs or organizations: Not on file    Relationship status: Not on file  Other Topics Concern  . Not on file  Social History Narrative  . Not on file    Family History  Problem Relation Age of Onset  . Atrial fibrillation Mother   . Breast cancer Maternal Aunt        mid 64's  . Breast cancer Maternal Aunt 35    Health Maintenance  Topic Date Due  . Hepatitis C Screening  1955/12/30  . HIV Screening  08/29/1969  . COLONOSCOPY  08/24/2016  . PAP SMEAR-Modifier   08/19/2017  . MAMMOGRAM  07/05/2019  . TETANUS/TDAP  12/12/2021  . INFLUENZA VACCINE  Completed    ----------------------------------------------------------------------------------------------------------------------------------------------------------------------------------------------------------------- Physical Exam BP 120/70   Pulse 74   Temp 98 F (36.7 C) (Temporal)   Ht _0  (1.626 m)   Wt 173 lb (78.5 kg)   SpO2 96%   BMI 29.70 kg/m   Physical Exam Constitutional:      General: She is not in acute distress. HENT:     Head: Normocephalic and atraumatic.     Right Ear: Tympanic membrane normal.     Left Ear: Tympanic membrane normal.     Nose: Nose normal.     Mouth/Throat:     Mouth: Mucous membranes are moist.  Eyes:     General: No scleral icterus.    Conjunctiva/sclera: Conjunctivae normal.  Neck:     Musculoskeletal: Normal range of motion and neck supple.     Thyroid: No thyromegaly.  Cardiovascular:     Rate and Rhythm: Normal rate and regular rhythm.     Heart sounds: Normal heart sounds.  Pulmonary:     Effort: Pulmonary effort is normal.     Breath sounds: Normal breath sounds.  Abdominal:     General: Bowel sounds are normal. There is no distension.     Palpations: Abdomen is soft.     Tenderness: There is no abdominal tenderness. There is no guarding.  Musculoskeletal: Normal range of motion.  Lymphadenopathy:     Cervical: No cervical adenopathy.  Skin:    General: Skin is warm and dry.     Findings: No rash.  Neurological:     General: No focal deficit present.     Mental Status: She is alert and oriented to person, place, and time.     Cranial Nerves: No cranial nerve deficit.     Coordination: Coordination normal.  Psychiatric:        Mood and Affect: Mood normal.        Behavior: Behavior normal.      ------------------------------------------------------------------------------------------------------------------------------------------------------------------------------------------------------------------- Assessment and Plan  PAF (paroxysmal atrial fibrillation) -Stable with toprol-xl.  Followed by cardiology.   Well adult exam Well adult Orders Placed This Encounter  Procedures  . Flu Vaccine QUAD 6+ mos PF IM (Fluarix Quad PF)  . Comp Met (CMET)    Standing Status:   Future    Standing Expiration Date:   01/21/2020  . CBC    Standing Status:   Future    Standing Expiration Date:   01/21/2020  . Lipid panel  Standing Status:   Future    Standing Expiration Date:   01/21/2020  . TSH    Standing Status:   Future    Standing Expiration Date:   01/21/2020  . Vitamin D (25 hydroxy)    Standing Status:   Future    Standing Expiration Date:   01/21/2020  . Ambulatory referral to Gastroenterology    Referral Priority:   Routine    Referral Type:   Consultation    Referral Reason:   Specialty Services Required    Number of Visits Requested:   1  Screening: Colon cancer, lipid.  She will f/u with Gyn for cervical cancer screening.  Immunizations: Flu vaccine Anticipatory guidance/Risk factor Reduction:  Recommendations per AVS

## 2019-01-21 NOTE — Patient Instructions (Signed)
It was very nice to meet you today!   Preventive Care 32-64 Years Old, Female Preventive care refers to visits with your health care provider and lifestyle choices that can promote health and wellness. This includes:  A yearly physical exam. This may also be called an annual well check.  Regular dental visits and eye exams.  Immunizations.  Screening for certain conditions.  Healthy lifestyle choices, such as eating a healthy diet, getting regular exercise, not using drugs or products that contain nicotine and tobacco, and limiting alcohol use. What can I expect for my preventive care visit? Physical exam Your health care provider will check your:  Height and weight. This may be used to calculate body mass index (BMI), which tells if you are at a healthy weight.  Heart rate and blood pressure.  Skin for abnormal spots. Counseling Your health care provider may ask you questions about your:  Alcohol, tobacco, and drug use.  Emotional well-being.  Home and relationship well-being.  Sexual activity.  Eating habits.  Work and work Statistician.  Method of birth control.  Menstrual cycle.  Pregnancy history. What immunizations do I need?  Influenza (flu) vaccine  This is recommended every year. Tetanus, diphtheria, and pertussis (Tdap) vaccine  You may need a Td booster every 10 years. Varicella (chickenpox) vaccine  You may need this if you have not been vaccinated. Zoster (shingles) vaccine  You may need this after age 38. Measles, mumps, and rubella (MMR) vaccine  You may need at least one dose of MMR if you were born in 1957 or later. You may also need a second dose. Pneumococcal conjugate (PCV13) vaccine  You may need this if you have certain conditions and were not previously vaccinated. Pneumococcal polysaccharide (PPSV23) vaccine  You may need one or two doses if you smoke cigarettes or if you have certain conditions. Meningococcal conjugate  (MenACWY) vaccine  You may need this if you have certain conditions. Hepatitis A vaccine  You may need this if you have certain conditions or if you travel or work in places where you may be exposed to hepatitis A. Hepatitis B vaccine  You may need this if you have certain conditions or if you travel or work in places where you may be exposed to hepatitis B. Haemophilus influenzae type b (Hib) vaccine  You may need this if you have certain conditions. Human papillomavirus (HPV) vaccine  If recommended by your health care provider, you may need three doses over 6 months. You may receive vaccines as individual doses or as more than one vaccine together in one shot (combination vaccines). Talk with your health care provider about the risks and benefits of combination vaccines. What tests do I need? Blood tests  Lipid and cholesterol levels. These may be checked every 5 years, or more frequently if you are over 31 years old.  Hepatitis C test.  Hepatitis B test. Screening  Lung cancer screening. You may have this screening every year starting at age 52 if you have a 30-pack-year history of smoking and currently smoke or have quit within the past 15 years.  Colorectal cancer screening. All adults should have this screening starting at age 66 and continuing until age 43. Your health care provider may recommend screening at age 75 if you are at increased risk. You will have tests every 1-10 years, depending on your results and the type of screening test.  Diabetes screening. This is done by checking your blood sugar (glucose) after you have  not eaten for a while (fasting). You may have this done every 1-3 years.  Mammogram. This may be done every 1-2 years. Talk with your health care provider about when you should start having regular mammograms. This may depend on whether you have a family history of breast cancer.  BRCA-related cancer screening. This may be done if you have a family  history of breast, ovarian, tubal, or peritoneal cancers.  Pelvic exam and Pap test. This may be done every 3 years starting at age 57. Starting at age 77, this may be done every 5 years if you have a Pap test in combination with an HPV test. Other tests  Sexually transmitted disease (STD) testing.  Bone density scan. This is done to screen for osteoporosis. You may have this scan if you are at high risk for osteoporosis. Follow these instructions at home: Eating and drinking  Eat a diet that includes fresh fruits and vegetables, whole grains, lean protein, and low-fat dairy.  Take vitamin and mineral supplements as recommended by your health care provider.  Do not drink alcohol if: ? Your health care provider tells you not to drink. ? You are pregnant, may be pregnant, or are planning to become pregnant.  If you drink alcohol: ? Limit how much you have to 0-1 drink a day. ? Be aware of how much alcohol is in your drink. In the [place] one drink equals one 12 oz bottle of beer (355 mL), one 5 oz glass of wine (148 mL), or one 1 oz glass of hard liquor (44 mL). Lifestyle  Take daily care of your teeth and gums.  Stay active. Exercise for at least 30 minutes on 5 or more days each week.  Do not use any products that contain nicotine or tobacco, such as cigarettes, e-cigarettes, and chewing tobacco. If you need help quitting, ask your health care provider.  If you are sexually active, practice safe sex. Use a condom or other form of birth control (contraception) in order to prevent pregnancy and STIs (sexually transmitted infections).  If told by your health care provider, take low-dose aspirin daily starting at age 28. What's next?  Visit your health care provider once a year for a well check visit.  Ask your health care provider how often you should have your eyes and teeth checked.  Stay up to date on all vaccines. This information is not intended to replace advice given to you  by your health care provider. Make sure you discuss any questions you have with your health care provider. Document Released: 03/27/2015 Document Revised: 11/09/2017 Document Reviewed: 11/09/2017 Elsevier Patient Education  2020 [employer].

## 2019-01-23 ENCOUNTER — Other Ambulatory Visit: Payer: BC Managed Care – PPO

## 2019-01-23 ENCOUNTER — Telehealth: Payer: Self-pay

## 2019-01-23 NOTE — Telephone Encounter (Signed)

## 2019-01-24 ENCOUNTER — Other Ambulatory Visit (INDEPENDENT_AMBULATORY_CARE_PROVIDER_SITE_OTHER): Payer: BC Managed Care – PPO

## 2019-01-24 ENCOUNTER — Other Ambulatory Visit: Payer: Self-pay

## 2019-01-24 DIAGNOSIS — I480 Paroxysmal atrial fibrillation: Secondary | ICD-10-CM

## 2019-01-24 DIAGNOSIS — Z13220 Encounter for screening for lipoid disorders: Secondary | ICD-10-CM

## 2019-01-24 DIAGNOSIS — Z0000 Encounter for general adult medical examination without abnormal findings: Secondary | ICD-10-CM | POA: Diagnosis not present

## 2019-01-24 LAB — COMPREHENSIVE METABOLIC PANEL
ALT: 14 U/L (ref 0–35)
AST: 17 U/L (ref 0–37)
Albumin: 4.3 g/dL (ref 3.5–5.2)
Alkaline Phosphatase: 103 U/L (ref 39–117)
BUN: 17 mg/dL (ref 6–23)
CO2: 29 mEq/L (ref 19–32)
Calcium: 9.5 mg/dL (ref 8.4–10.5)
Chloride: 103 mEq/L (ref 96–112)
Creatinine, Ser: 0.9 mg/dL (ref 0.40–1.20)
GFR: 62.96 mL/min (ref >60.00)
Glucose, Bld: 113 mg/dL — ABNORMAL HIGH (ref 70–99)
Potassium: 4.3 mEq/L (ref 3.5–5.1)
Sodium: 138 mEq/L (ref 135–145)
Total Bilirubin: 0.4 mg/dL (ref 0.2–1.2)
Total Protein: 7.1 g/dL (ref 6.0–8.3)

## 2019-01-24 LAB — CBC
HCT: 38.5 % (ref 36.0–46.0)
Hemoglobin: 12.7 g/dL (ref 12.0–15.0)
MCHC: 33 g/dL (ref 30.0–36.0)
MCV: 95.6 fl (ref 78.0–100.0)
Platelets: 308 10*3/uL (ref 150.0–400.0)
RBC: 4.03 Mil/uL (ref 3.87–5.11)
RDW: 13.5 % (ref 11.5–15.5)
WBC: 4.6 10*3/uL (ref 4.0–10.5)

## 2019-01-24 LAB — VITAMIN D 25 HYDROXY (VIT D DEFICIENCY, FRACTURES): VITD: 25.85 ng/mL — ABNORMAL LOW (ref 30.00–100.00)

## 2019-01-24 LAB — LIPID PANEL
Cholesterol: 203 mg/dL — ABNORMAL HIGH (ref 0–200)
HDL: 49.3 mg/dL (ref >39.00)
LDL Cholesterol: 124 mg/dL — ABNORMAL HIGH (ref 0–99)
NonHDL: 153.85
Total CHOL/HDL Ratio: 4
Triglycerides: 150 mg/dL — ABNORMAL HIGH (ref 0.0–149.0)
VLDL: 30 mg/dL (ref 0.0–40.0)

## 2019-01-24 LAB — TSH: TSH: 2.48 u[IU]/mL (ref 0.35–4.50)

## 2019-01-29 NOTE — Progress Notes (Signed)
Please let patient know: -Vitamin D levels are a little low.  I would recommend that she take 1000 units of vitamin d daily (available OTC) -Cholesterol is elevated, for now I would recommend a low fat/low cholesterol diet with regular exercise.  -Blood sugar is a little elevated as well.  I think this would improve with lifestyle change as well.

## 2019-02-13 ENCOUNTER — Other Ambulatory Visit: Payer: Self-pay | Admitting: Cardiovascular Disease

## 2019-02-21 ENCOUNTER — Encounter: Payer: Self-pay | Admitting: Gastroenterology

## 2019-03-05 DIAGNOSIS — Z1231 Encounter for screening mammogram for malignant neoplasm of breast: Secondary | ICD-10-CM | POA: Diagnosis not present

## 2019-03-06 ENCOUNTER — Encounter: Payer: Self-pay | Admitting: Gynecology

## 2019-03-06 DIAGNOSIS — I4891 Unspecified atrial fibrillation: Secondary | ICD-10-CM | POA: Insufficient documentation

## 2019-03-06 DIAGNOSIS — R42 Dizziness and giddiness: Secondary | ICD-10-CM | POA: Insufficient documentation

## 2019-03-21 ENCOUNTER — Other Ambulatory Visit: Payer: Self-pay

## 2019-03-21 ENCOUNTER — Ambulatory Visit (AMBULATORY_SURGERY_CENTER): Payer: 59 | Admitting: *Deleted

## 2019-03-21 VITALS — Temp 97.1°F | Ht 64.0 in | Wt 176.0 lb

## 2019-03-21 DIAGNOSIS — Z1211 Encounter for screening for malignant neoplasm of colon: Secondary | ICD-10-CM

## 2019-03-21 DIAGNOSIS — Z1159 Encounter for screening for other viral diseases: Secondary | ICD-10-CM

## 2019-03-21 MED ORDER — SUPREP BOWEL PREP KIT 17.5-3.13-1.6 GM/177ML PO SOLN: 1.0000 | Freq: Once | ORAL | 0 refills | Status: AC

## 2019-03-21 NOTE — Progress Notes (Signed)
No egg or soy allergy known to patient  No issues with past sedation with any surgeries  or procedures, no intubation problems  No diet pills per patient No home 02 use per patient  No blood thinners per patient  Pt denies issues with constipation  No A fib or A flutter  EMMI video sent to pt's e mail  Suprep Coupon $15   Due to the COVID-19 pandemic we are asking patients to follow these guidelines. Please only bring one care partner. Please be aware that your care partner may wait in the car in the parking lot or if they feel like they will be too hot to wait in the car, they may wait in the lobby on the 4th floor. All care partners are required to wear a mask the entire time (we do not have any that we can provide them), they need to practice social distancing, and we will do a Covid check for all patient's and care partners when you arrive. Also we will check their temperature and your temperature. If the care partner waits in their car they need to stay in the parking lot the entire time and we will call them on their cell phone when the patient is ready for discharge so they can bring the car to the front of the building. Also all patient's will need to wear a mask into building.  Suprep $15 coupon

## 2019-03-26 LAB — HM MAMMOGRAPHY

## 2019-03-27 ENCOUNTER — Encounter: Payer: Self-pay | Admitting: Family Medicine

## 2019-04-04 ENCOUNTER — Encounter: Payer: BC Managed Care – PPO | Admitting: Gastroenterology

## 2019-05-11 ENCOUNTER — Other Ambulatory Visit: Payer: Self-pay | Admitting: Cardiovascular Disease

## 2019-06-03 ENCOUNTER — Other Ambulatory Visit: Payer: Self-pay | Admitting: Cardiovascular Disease

## 2019-06-04 ENCOUNTER — Encounter: Payer: Self-pay | Admitting: Cardiovascular Disease

## 2019-06-04 ENCOUNTER — Ambulatory Visit: Payer: 59 | Admitting: Cardiovascular Disease

## 2019-06-04 ENCOUNTER — Other Ambulatory Visit: Payer: Self-pay

## 2019-06-04 VITALS — BP 120/82 | HR 67 | Temp 95.9°F | Ht 64.0 in | Wt 173.0 lb

## 2019-06-04 DIAGNOSIS — Z79899 Other long term (current) drug therapy: Secondary | ICD-10-CM | POA: Diagnosis not present

## 2019-06-04 DIAGNOSIS — E7800 Pure hypercholesterolemia, unspecified: Secondary | ICD-10-CM

## 2019-06-04 DIAGNOSIS — I480 Paroxysmal atrial fibrillation: Secondary | ICD-10-CM | POA: Diagnosis not present

## 2019-06-04 DIAGNOSIS — U071 COVID-19: Secondary | ICD-10-CM

## 2019-06-04 DIAGNOSIS — R002 Palpitations: Secondary | ICD-10-CM

## 2019-06-04 DIAGNOSIS — E559 Vitamin D deficiency, unspecified: Secondary | ICD-10-CM

## 2019-06-04 NOTE — Progress Notes (Signed)
Patient ID: Shirley Carroll, female   DOB: 01/29/1956, 64 y.o.   MRN: [id number]     HPI: Shirley Carroll is a 64 y.o. female who presents for a 33 month follow-up evaluation.  Shirley Carroll presented to the emergency room on 08/10/2014 after a 2 day history of intermittent shortness of breath and chest pressure.  She was found to have atrial fibrillation on ECG  with a controlled ventricular rate at 98 bpm.  Laboratory was unremarkable including TSH, CBC, BMP, troponin and d-dimer.  She is negative.  Chest x-ray.  She was started on low-dose metoprolo  tartrate 12.5 mg twice a day with follow-up recommended.  She has a cha2ds2vasc score of 1 and anticoagulation therapy was not instituted.  She was seen by extenders in June 2016 and requested that I see her in follow-up for cardiology care since I take care of her husband and she presents for evaluation.  She underwent an echo Doppler study on 09/02/2014.  This revealed normal systolic function with grade 2 diastolic dysfunction.  Trivial mitral regurgitation.  There was very minimal increased.  PA pressure 32 mm.  When I saw her in 2016, she denied any he any recurrent episodes of palpitations.  She was walking daily and denied exertional symptomatology.  In January 2017 she had a normal routine treadmill test.  I last saw her in June 2018 at which time she was feeling well.  She was experiencing rare palpitations.  She denied associated chest pain or shortness of breath. She denied significant caffeine intake.  There was no use of pseudoephedrine preparations.  She has been taking Toprol-XL 12.5 mg at bedtime.   Over the past several years, she states that she had been doing fairly well and only notes very rare palpitations which lasts seconds.  She developed Covid infection in September with symptoms of fever for 2 to 3 weeks, sore throat, persistent fatigue and weakness.  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  In November 2020 she had lab work and cholesterol was 203 triglycerides 150 LDL cholesterol 124.  She has been taking vitamin D 1000 units.  Vitamin D level was 25.8.  Presently, she denies any chest pain.  She is able to walk without shortness of breath.  Essentially palpitations have completely resolved with the exception of a very rare isolated episode.  She presents for evaluation.   Past Medical History:  Diagnosis Date  . Allergy   . Atrial fibrillation ([place])   . Colitis   . Herniated disc   . Herpes zoster 11/07/2011   11/07/2011 right side waistline -T-11 dermatonal distribution >valtrex and steroid taper    . History of headache   . Osteoarthritis   . PONV (postoperative nausea and vomiting)   . Renal calculus   . RENAL CALCULUS, HX OF 05/01/2007   Qualifier: Diagnosis of  By: Ronnald Ramp CNA/MA, Janett Billow    . Vertigo     Past Surgical History:  Procedure Laterality Date  . BACK SURGERY  2004  . COLONOSCOPY    . GYNECOLOGIC CRYOSURGERY  1982  . SHOULDER SURGERY    . VAGINAL HYSTERECTOMY  1991   CIS of the cervix    Allergies  Allergen Reactions  . Propoxyphene Hcl     REACTION: extreme nausea  [place]    Current Outpatient Medications  Medication Sig Dispense Refill  . acetaminophen (TYLENOL) 500 MG tablet Take 500 mg by mouth every 6 (six)  hours as needed.    Marland Kitchen aspirin (ASPIRIN ADULT LOW DOSE) 81 MG EC tablet Adult Low Dose Aspirin 81 mg tablet,delayed release    . aspirin-acetaminophen-caffeine (EXCEDRIN MIGRAINE) 250-250-65 MG per tablet Take 2 tablets by mouth every 6 (six) hours as needed for headache.    . metoprolol succinate (TOPROL-XL) 25 MG 24 hr tablet Take 1 tablet (25 mg total) by mouth daily. 25 tablet 0   No current facility-administered medications for this visit.    Social History   Socioeconomic History  . Marital status: Married    Spouse name: Photographer  . Number of children: Not on file  . Years of education: Not on file  . Highest  education level: Not on file  Occupational History    Employer: [employer]  Tobacco Use  . Smoking status: Never Smoker  . Smokeless tobacco: Never Used  Substance and Sexual Activity  . Alcohol use: Yes    Alcohol/week: 3.0 standard drinks    Types: 3 Standard drinks or equivalent per week  . Drug use: No  . Sexual activity: Yes    Birth control/protection: Surgical, Implant    Comment: 1st intercourse 64 yo-More than 5 partners  Other Topics Concern  . Not on file  Social History Narrative  . Not on file   Social Determinants of Health   Financial Resource Strain:   . Difficulty of Paying Living Expenses:   Food Insecurity:   . Worried About Charity fundraiser in the Last Year:   . Arboriculturist in the Last Year:   Transportation Needs:   . Film/video editor (Medical):   Marland Kitchen Lack of Transportation (Non-Medical):   Physical Activity:   . Days of Exercise per Week:   . Minutes of Exercise per Session:   Stress:   . Feeling of Stress :   Social Connections:   . Frequency of Communication with Friends and Family:   . Frequency of Social Gatherings with Friends and Family:   . Attends Religious Services:   . Active Member of Clubs or Organizations:   . Attends Archivist Meetings:   Marland Kitchen Marital Status:   Intimate Partner Violence:   . Fear of Current or Ex-Partner:   . Emotionally Abused:   Marland Kitchen Physically Abused:   . 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  She is married for 35 years.  She has 1 daughter age 75 and 2 grandchildren.  Family History  Problem Relation Age of Onset  . Atrial fibrillation Mother   . Breast cancer Maternal Aunt        mid 29's  . Breast cancer Maternal Aunt 35  . Colon cancer Neg Hx   . Colon polyps Neg Hx   . Esophageal cancer Neg Hx   . Rectal cancer Neg Hx   . Stomach cancer Neg Hx    Family history is notable that mother has a history of atrial  fibrillation.  Father has heart disease.  She does not have any siblings.  ROS General: Negative; No fevers, chills, or night sweats HEENT: Negative; No changes in vision or hearing, sinus congestion, difficulty swallowing Pulmonary: Negative; No cough, wheezing, shortness of breath, hemoptysis Cardiovascular: See HPI:  GI: Negative; No nausea, vomiting, diarrhea, or abdominal pain GU: Negative; No dysuria, hematuria, or difficulty voiding Musculoskeletal: Negative; no myalgias, joint pain, or weakness Hematologic: Negative; no easy bruising, bleeding Endocrine: Vitamin D insufficiency Neuro:  Negative; no changes in balance, headaches Skin: Negative; No rashes or skin lesions Psychiatric: Negative; No behavioral problems, depression Sleep: Negative; No snoring,  daytime sleepiness, hypersomnolence, bruxism, restless legs, hypnogognic hallucinations. Other comprehensive 14 point system review is negative   Physical Exam BP 120/82 (BP Location: Left Arm, Patient Position: Sitting, Cuff Size: Normal)   Pulse 67   Temp (!) 95.9 F (35.5 C)   Ht '5\' 4"'  (1.626 m)   Wt 173 lb (78.5 kg)   BMI 29.70 kg/m    Repeat blood pressure by me 128/80  Wt Readings from Last 3 Encounters:  06/04/19 173 lb (78.5 kg)  03/21/19 176 lb (79.8 kg)  01/21/19 173 lb (78.5 kg)   General: Alert, oriented, no distress.  Skin: normal turgor, no rashes, warm and dry HEENT: Normocephalic, atraumatic. Pupils equal round and reactive to light; sclera anicteric; extraocular muscles intact;  Nose without nasal septal hypertrophy Mouth/Parynx benign; Mallinpatti scale 2/3 Neck: No JVD, no carotid bruits; normal carotid upstroke Lungs: clear to ausculatation and percussion; no wheezing or rales Chest wall: without tenderness to palpitation Heart: PMI not displaced, RRR, s1 s2 normal, 1/6 systolic murmur, no diastolic murmur, no rubs, gallops, thrills, or heaves Abdomen: soft, nontender; no hepatosplenomehaly,  BS+; abdominal aorta nontender and not dilated by palpation. Back: no CVA tenderness Pulses 2+ Musculoskeletal: full range of motion, normal strength, no joint deformities Extremities: no clubbing cyanosis or edema, Homan's sign negative  Neurologic: grossly nonfocal; Cranial nerves grossly wnl Psychologic: Normal mood and affect   ECG (independently read by me): Normal sinus rhythm at 67 bpm.  No ectopy.  Normal intervals.  QTc interval 420 ms.  September 09, 2016 ECG (independently read by me): Normal sinus rhythm at 71 bpm.  No ST segment changes.  Normal intervals with QTc interval 428 ms.  September 2016 ECG (independently read by me): Normal sinus rhythm at 66 bpm.  No significant ST-T changes.  LABS:  BMP Latest Ref Rng & Units 01/24/2019 12/08/2014 08/10/2014  Glucose 70 - 99 mg/dL 113(H) 100(H) 100(H)  BUN 6 - 23 mg/dL '17 16 18  ' Creatinine 0.40 - 1.20 mg/dL 0.90 0.81 0.75  Sodium 135 - 145 mEq/L 138 139 136  Potassium 3.5 - 5.1 mEq/L 4.3 4.8 4.0  Chloride 96 - 112 mEq/L 103 104 103  CO2 19 - 32 mEq/L '29 29 23  ' Calcium 8.4 - 10.5 mg/dL 9.5 9.4 9.6   Hepatic Function Latest Ref Rng & Units 01/24/2019 12/08/2014 11/21/2013  Total Protein 6.0 - 8.3 g/dL 7.1 6.2 6.8  Albumin 3.5 - 5.2 g/dL 4.3 3.9 4.4  AST 0 - 37 U/L '17 15 21  ' ALT 0 - 35 U/L '14 14 17  ' Alk Phosphatase 39 - 117 U/L 103 88 81  Total Bilirubin 0.2 - 1.2 mg/dL 0.4 0.4 0.3  Bilirubin, Direct 0.0 - 0.3 mg/dL - - -   CBC Latest Ref Rng & Units 01/24/2019 08/10/2014 11/21/2013  WBC 4.0 - 10.5 K/uL 4.6 6.2 5.9  Hemoglobin 12.0 - 15.0 g/dL 12.7 13.7 13.1  Hematocrit 36.0 - 46.0 % 38.5 40.3 37.9  Platelets 150.0 - 400.0 K/uL 308.0 231 283   Lab Results  Component Value Date   MCV 95.6 01/24/2019   MCV 91.0 08/10/2014   MCV 90.0 11/21/2013    Lab Results  Component Value Date   TSH 2.48 01/24/2019    BNP No results found for: BNP  ProBNP No results found for: PROBNP   Lipid Panel  Component Value  Date/Time   CHOL 203 (H) 01/24/2019 1011   TRIG 150.0 (H) 01/24/2019 1011   HDL 49.30 01/24/2019 1011   CHOLHDL 4 01/24/2019 1011   VLDL 30.0 01/24/2019 1011   LDLCALC 124 (H) 01/24/2019 1011     RADIOLOGY: No results found.  IMPRESSION:  1. PAF (paroxysmal atrial fibrillation) ([place])   2. Medication management   3. Palpitations   4. Pure hypercholesterolemia   5. Vitamin D insufficiency   6. COVID-19 virus infection: September 2020     ASSESSMENT AND PLAN: Ms. Shirley Carroll is a 64 year old female who developed an episode of isolated paroxysmal atrial fibrillation in May 2016 which self resolved.  She was advised to take metoprolol tartrate 12.5 mg twice a day, but had only been taking this 12.5 mg at bedtime.  When I saw her in follow-up, I change this to metoprolol succinate for more long acting therapy.  Over the last several years she has continued to be fairly stable with reference to her palpitations which only very rarely occur and seem to be fairly well suppressed with long-acting metoprolol at 25 mg daily.  She has been on low-dose aspirin 81 mg.  She is unaware of any recurrent episodes of atrial fibrillation.  She has not had recent laboratory in with her LDL of 124 I discussed data regarding subclinical atherosclerosis and potential need to initiate therapy.  I will recheck a be met, fasting lipid panel, hemoglobin A1c levels.  I also recommended further titration of her supplemental vitamin D to 2000 mg daily.  We discussed continued exercise.  She will be following up with her primary care provider Dr. Luetta Nutting in [place].  Presently she has no sequelae of her September Covid infection but did have prolonged fatigue for several weeks.   [name], MD, [facility]  06/06/2019 4:29 PM

## 2019-06-04 NOTE — Patient Instructions (Signed)
Medication Instructions:  CONTINUE WITH CURRENT MEDICATIONS. NO CHANGES.  *If you need a refill on your cardiac medications before your next appointment, please call your pharmacy*   Lab Work: FASTING LABS: BMET HGBA1C  LIPIDS  If you have labs (blood work) drawn today and your tests are completely normal, you will receive your results only by: Marland Kitchen MyChart Message (if you have MyChart) OR . A paper copy in the mail If you have any lab test that is abnormal or we need to change your treatment, we will call you to review the results.   Follow-Up: At [facility], you and your health needs are our priority.  As part of our continuing mission to provide you with exceptional heart care, we have created designated Provider Care Teams.  These Care Teams include your primary Cardiologist (physician) and Advanced Practice Providers (APPs -  Physician Assistants and Nurse Practitioners) who all work together to provide you with the care you need, when you need it.  We recommend signing up for the patient portal called "MyChart".  Sign up information is provided on this After Visit Summary.  MyChart is used to connect with patients for Virtual Visits (Telemedicine).  Patients are able to view lab/test results, encounter notes, upcoming appointments, etc.  Non-urgent messages can be sent to your provider as well.   To learn more about what you can do with MyChart, go to [email or web address].    Your next appointment:   6 month(s)  The format for your next appointment:   In Person  Provider:   Nicki Guadalajara, MD

## 2019-06-06 ENCOUNTER — Ambulatory Visit: Payer: 59 | Attending: Internal Medicine

## 2019-06-06 ENCOUNTER — Encounter: Payer: Self-pay | Admitting: Cardiovascular Disease

## 2019-06-06 DIAGNOSIS — Z23 Encounter for immunization: Secondary | ICD-10-CM

## 2019-06-06 NOTE — Progress Notes (Signed)
   Covid-19 Vaccination Clinic  Name:  Shirley Carroll    MRN: [id number] DOB: Sep 24, 1955  06/06/2019  [name] was observed post Covid-19 immunization for 15 minutes without incident. She was provided with Vaccine Information Sheet and instruction to access the V-Safe system.   [name] was instructed to call 911 with any severe reactions post vaccine: Marland Kitchen Difficulty breathing  . Swelling of face and throat  . A fast heartbeat  . A bad rash all over body  . Dizziness and weakness   Immunizations Administered    Name Date Dose VIS Date Route   Pfizer COVID-19 Vaccine 06/06/2019 12:14 PM 0.3 mL 02/22/2019 Intramuscular   Manufacturer: [employer], [employer]   Lot: [id number]   NDC: [address]-5

## 2019-06-14 ENCOUNTER — Other Ambulatory Visit: Payer: Self-pay | Admitting: Cardiovascular Disease

## 2019-06-28 ENCOUNTER — Telehealth: Payer: Self-pay | Admitting: Family Medicine

## 2019-06-28 NOTE — Telephone Encounter (Signed)
Patient spouse called and reports that his wife was not feeling well. I have left a message for the wife to go to UC and be evaluated over the weekend. I called the spouse and give him signs to look out for and to take her to UC to be evaluated if she became worse. He did not have any other questions.

## 2019-07-01 ENCOUNTER — Ambulatory Visit: Payer: 59

## 2019-07-03 ENCOUNTER — Telehealth: Payer: Self-pay

## 2019-07-03 NOTE — Telephone Encounter (Signed)
Spoke with patient's husband. Patient was seen at an outside urgent care and was tested for COVID. She tested negative for COVID, but did have COVID a couple months ago.  She had a tooth pulled Wednesday and was on antibiotics and SX started Thursday. Patient has a fever, chills, and body aches. Taking Tylenol Q4H for fever and still on Amoxicillin from dentist. All symptoms started after tooth was pulled.   I advised patient's husband that it sounds like there is infection in the tooth/mouth, and I recommended following up with dentist, especially since today is the last day of her antibiotic.   Patent's husband agreeable, will take patient back to dentist today.   FYI to PCP

## 2019-07-03 NOTE — Telephone Encounter (Signed)
Sure, I can see her in clinic if additional test returns negative.

## 2019-07-03 NOTE — Telephone Encounter (Signed)
Patient's husband called me back. He took patient to dentist and he didn't see anything wrong with the site in the mouth and he suggested that patient may have a reaction to the amoxicillin she was on..  Patient's husband states that he is going to take her to get a second COVID test to verify it wasn't a false negative because he does not think that her not feeling well is from a reaction to antibiotic.   Patient stopped antibiotic, will call and give me an update tomorrow.   Dr Ashley Royalty, if patients second test comes back negative, would you feel comfortable seeing her in office?

## 2019-07-04 NOTE — Telephone Encounter (Signed)
Spoke with patient, fever free for 12 hours now.   She has my direct number and will keep in touch with me.   Getting results today from second COVID test. I went ahead and scheduled her for 10:30 tomorrow, will cancel appt if COVID comes back positive.

## 2019-07-05 ENCOUNTER — Ambulatory Visit (INDEPENDENT_AMBULATORY_CARE_PROVIDER_SITE_OTHER): Payer: 59 | Admitting: Family Medicine

## 2019-07-05 ENCOUNTER — Ambulatory Visit: Payer: 59 | Admitting: Family Medicine

## 2019-07-05 DIAGNOSIS — R5383 Other fatigue: Secondary | ICD-10-CM

## 2019-07-05 NOTE — Patient Instructions (Signed)
I'm glad you are feeling better! If symptoms do not continue to improve or you develop new or worsening symptoms please call or message through mychart.   Have a great day!

## 2019-07-07 ENCOUNTER — Encounter: Payer: Self-pay | Admitting: Family Medicine

## 2019-07-07 DIAGNOSIS — R5383 Other fatigue: Secondary | ICD-10-CM | POA: Insufficient documentation

## 2019-07-07 NOTE — Assessment & Plan Note (Signed)
Fatigue associated with fever, now with improving symptoms consistent with viral process.  COVID test negative x2  Recommend continued supportive care Call if symptoms return or worsen.

## 2019-07-07 NOTE — Progress Notes (Signed)
Shirley Carroll - 64 y.o. female MRN [id number]  Date of birth: May 22, 1955  Subjective Chief Complaint  Patient presents with  . Fatigue    HPI Shirley Carroll is a 64 y.o. female here today with complaint of fever and fatigue.  She reports that symptoms started about 1 week ago.  She had dental procedure the day prior to symptom onset.  She has followed up with her dentist and was told everything looked normal.  She has also had negative COVID test x2. Today she reports that she is feeling better.  She has been afebrile for about 48 hours.  Fatigue has improved some as well.  She denies any other symptoms at this time.    ROS:  A comprehensive ROS was completed and negative except as noted per HPI  Allergies  Allergen Reactions  . Propoxyphene Hcl     REACTION: extreme nausea  Dar[place]    Past Medical History:  Diagnosis Date  . Allergy   . Atrial fibrillation ([place])   . Colitis   . Herniated disc   . Herpes zoster 11/07/2011   11/07/2011 right side waistline -T-11 dermatonal distribution >valtrex and steroid taper    . History of headache   . Osteoarthritis   . PONV (postoperative nausea and vomiting)   . Renal calculus   . RENAL CALCULUS, HX OF 05/01/2007   Qualifier: Diagnosis of  By: Ronnald Ramp CNA/MA, Janett Billow    . Vertigo     Past Surgical History:  Procedure Laterality Date  . BACK SURGERY  2004  . COLONOSCOPY    . GYNECOLOGIC CRYOSURGERY  1982  . SHOULDER SURGERY    . VAGINAL HYSTERECTOMY  1991   CIS of the cervix    Social History   Socioeconomic History  . Marital status: Married    Spouse name: Photographer  . Number of children: Not on file  . Years of education: Not on file  . Highest education level: Not on file  Occupational History    Employer: [employer]  Tobacco Use  . Smoking status: Never Smoker  . Smokeless tobacco: Never Used  Substance and Sexual Activity  . Alcohol use: Yes    Alcohol/week: 3.0 standard drinks    Types: 3 Standard drinks or  equivalent per week  . Drug use: No  . Sexual activity: Yes    Birth control/protection: Surgical, Implant    Comment: 1st intercourse 64 yo-More than 5 partners  Other Topics Concern  . Not on file  Social History Narrative  . Not on file   Social Determinants of Health   Financial Resource Strain:   . Difficulty of Paying Living Expenses:   Food Insecurity:   . Worried About Charity fundraiser in the Last Year:   . Arboriculturist in the Last Year:   Transportation Needs:   . Film/video editor (Medical):   Marland Kitchen Lack of Transportation (Non-Medical):   Physical Activity:   . Days of Exercise per Week:   . Minutes of Exercise per Session:   Stress:   . Feeling of Stress :   Social Connections:   . Frequency of Communication with Friends and Family:   . Frequency of Social Gatherings with Friends and Family:   . Attends Religious Services:   . Active Member of Clubs or Organizations:   . Attends Archivist Meetings:   Marland Kitchen Marital Status:     Family History  Problem Relation Age of Onset  .  Atrial fibrillation Mother   . Breast cancer Maternal Aunt        mid 46's  . Breast cancer Maternal Aunt 35  . Colon cancer Neg Hx   . Colon polyps Neg Hx   . Esophageal cancer Neg Hx   . Rectal cancer Neg Hx   . Stomach cancer Neg Hx     Health Maintenance  Topic Date Due  . Hepatitis C Screening  Never done  . HIV Screening  Never done  . COLONOSCOPY  08/24/2016  . PAP SMEAR-Modifier  08/19/2017  . COVID-19 Vaccine (2 - Pfizer 2-dose series) 06/27/2019  . INFLUENZA VACCINE  10/13/2019  . MAMMOGRAM  03/25/2021  . TETANUS/TDAP  12/12/2021     ----------------------------------------------------------------------------------------------------------------------------------------------------------------------------------------------------------------- Physical Exam Ht 5\' 4"  (1.626 m)   BMI 29.70 kg/m   Physical Exam Constitutional:      Appearance: Normal  appearance.  HENT:     Head: Normocephalic and atraumatic.     Mouth/Throat:     Mouth: Mucous membranes are moist.  Eyes:     General: No scleral icterus. Cardiovascular:     Rate and Rhythm: Normal rate and regular rhythm.  Pulmonary:     Effort: Pulmonary effort is normal.     Breath sounds: Normal breath sounds.  Musculoskeletal:     Cervical back: Neck supple.  Lymphadenopathy:     Cervical: No cervical adenopathy.  Skin:    General: Skin is warm and dry.  Neurological:     General: No focal deficit present.     Mental Status: She is alert.  Psychiatric:        Mood and Affect: Mood normal.        Behavior: Behavior normal.     ------------------------------------------------------------------------------------------------------------------------------------------------------------------------------------------------------------------- Assessment and Plan  Fatigue Fatigue associated with fever, now with improving symptoms consistent with viral process.  COVID test negative x2  Recommend continued supportive care Call if symptoms return or worsen.    No orders of the defined types were placed in this encounter.   No follow-ups on file.    This visit occurred during the SARS-CoV-2 public health emergency.  Safety protocols were in place, including screening questions prior to the visit, additional usage of staff PPE, and extensive cleaning of exam room while observing appropriate contact time as indicated for disinfecting solutions.

## 2019-07-10 ENCOUNTER — Ambulatory Visit: Payer: 59 | Attending: Internal Medicine

## 2019-07-10 DIAGNOSIS — Z23 Encounter for immunization: Secondary | ICD-10-CM

## 2019-07-10 NOTE — Progress Notes (Signed)
.     Covid-19 Vaccination Clinic  Name:  Shirley Carroll    MRN: [id number] DOB: 06/21/1955  07/10/2019  Shirley Carroll was observed post Covid-19 immunization for 15 minutes  without incident. She was provided with Vaccine Information Sheet and instruction to access the V-Safe system.   Shirley Carroll was instructed to call 911 with any severe reactions post vaccine: Marland Kitchen Difficulty breathing  . Swelling of face and throat  . A fast heartbeat  . A bad rash all over body  . Dizziness and weakness   Immunizations Administered    Name Date Dose VIS Date Route   Pfizer COVID-19 Vaccine 07/10/2019 12:01 PM 0.3 mL 05/08/2018 Intramuscular   Manufacturer: [employer], [employer]   Lot: [id number]   NDC: [address]-6

## 2019-12-13 ENCOUNTER — Telehealth: Payer: Self-pay | Admitting: Cardiovascular Disease

## 2019-12-13 ENCOUNTER — Other Ambulatory Visit: Payer: Self-pay | Admitting: Cardiovascular Disease

## 2019-12-13 NOTE — Telephone Encounter (Signed)
*  STAT* If patient is at the pharmacy, call can be transferred to refill team.   1. Which medications need to be refilled? (please list name of each medication and dose if known)  metoprolol succinate (TOPROL-XL) 25 MG 24 hr tablet  2. Which pharmacy/location (including street and city if local pharmacy) is medication to be sent to? [id number] - [place], [place] - [address]  3. Do they need a 30 day or 90 day supply? 90    Pt is out of medication  Pt is scheduled for a virtual visit Tues 12/17/19

## 2019-12-17 ENCOUNTER — Telehealth: Payer: Self-pay

## 2019-12-17 ENCOUNTER — Encounter: Payer: No Typology Code available for payment source | Admitting: Physician Assistant

## 2019-12-17 NOTE — Telephone Encounter (Signed)
Reached out to patient via phone number provided for telehealth appointment. Left voicemail.

## 2019-12-17 NOTE — Progress Notes (Signed)
This encounter was created in error - please disregard.

## 2020-03-18 LAB — HM MAMMOGRAPHY

## 2020-03-20 ENCOUNTER — Encounter: Payer: Self-pay | Admitting: Family Medicine

## 2020-03-25 ENCOUNTER — Telehealth: Payer: Self-pay | Admitting: Cardiovascular Disease

## 2020-03-25 NOTE — Telephone Encounter (Signed)
Spoke with pt husband, last night while watching TV the patient developed atrial fib. Her bp was 128/70 and pulse by her fitbit was 102. She felt fine except she was scared, nervous and had some right shoulder pain. It lasted about 1 hour and after taking an extra 1/2 of metoprolol she went to bed and went to sleep. Today she feels fine he is just concerned about how long it lasted. Her last episode of atrial fib was 3 months ago. Reassurance and education related to atrial fib given to the patient husband. Next available appointment with Corine Shelter scheduled. Advised to call the office after hours for advice before going to the ER if she is feeling okay. Pt agreed with this plan.

## 2020-03-25 NOTE — Telephone Encounter (Signed)
Patient c/o Palpitations:  High priority if patient c/o lightheadedness, shortness of breath, or chest pain  1) How long have you had palpitations/irregular HR/ Afib? Are you having the symptoms now? Episode occurred last night, not having symptoms now   2) Are you currently experiencing lightheadedness, SOB or CP? No   3) Do you have a history of afib (atrial fibrillation) or irregular heart rhythm? Yes   4) Have you checked your BP or HR? (document readings if available): 128/70 HR 102 at time of episode  5) Are you experiencing any other symptoms? Pressure on right side of chest up around shoulder   Shirley Carroll is calling stating Shirley Carroll had an episode of Afib last night that lasted for about an hour that occurred around 10:00 pm. He states she also had CP on the right side when it occurred, but no symptoms or Afib are present now. He is wanting Shirley Carroll to be worked in for an appointment if possible with Corine Shelter or Dr. Tresa Endo. Please advise.

## 2020-04-27 ENCOUNTER — Encounter: Payer: Self-pay | Admitting: Cardiology

## 2020-04-27 ENCOUNTER — Ambulatory Visit (INDEPENDENT_AMBULATORY_CARE_PROVIDER_SITE_OTHER): Payer: No Typology Code available for payment source | Admitting: Cardiology

## 2020-04-27 ENCOUNTER — Other Ambulatory Visit: Payer: Self-pay

## 2020-04-27 VITALS — BP 118/80 | HR 69 | Ht 64.0 in | Wt 169.4 lb

## 2020-04-27 DIAGNOSIS — F909 Attention-deficit hyperactivity disorder, unspecified type: Secondary | ICD-10-CM

## 2020-04-27 DIAGNOSIS — I480 Paroxysmal atrial fibrillation: Secondary | ICD-10-CM | POA: Diagnosis not present

## 2020-04-27 DIAGNOSIS — I5189 Other ill-defined heart diseases: Secondary | ICD-10-CM | POA: Diagnosis not present

## 2020-04-27 DIAGNOSIS — Z79899 Other long term (current) drug therapy: Secondary | ICD-10-CM

## 2020-04-27 MED ORDER — APIXABAN 5 MG PO TABS: 5.0000 mg | ORAL_TABLET | Freq: Two times a day (BID) | ORAL | 6 refills | Status: DC

## 2020-04-27 NOTE — Patient Instructions (Addendum)
Medication Instructions:  STOP- Aspirin  START- Eliquis 5 mg by mouth twice a day  *If you need a refill on your cardiac medications before your next appointment, please call your pharmacy*   Lab Work: CBC and BMP  Testing/Procedures: None Ordered   Follow-Up: At [facility], you and your health needs are our priority.  As part of our continuing mission to provide you with exceptional heart care, we have created designated Provider Care Teams.  These Care Teams include your primary Cardiologist (physician) and Advanced Practice Providers (APPs -  Physician Assistants and Nurse Practitioners) who all work together to provide you with the care you need, when you need it.  We recommend signing up for the patient portal called "MyChart".  Sign up information is provided on this After Visit Summary.  MyChart is used to connect with patients for Virtual Visits (Telemedicine).  Patients are able to view lab/test results, encounter notes, upcoming appointments, etc.  Non-urgent messages can be sent to your provider as well.   To learn more about what you can do with MyChart, go to [email or web address].    Your next appointment:   6 month(s)  The format for your next appointment:   In Person  Provider:   You may see Nicki Guadalajara, MD or one of the following Advanced Practice Providers on your designated Care Team:    Azalee Course, PA-C  Micah Flesher, PA-C or   Judy Pimple, [place]

## 2020-04-27 NOTE — Progress Notes (Signed)
Cardiology Office Note:    Date:  04/27/2020   ID:  Shirley Carroll, DOB 1956/02/14, MRN [id number]  PCP:  Everrett Coombe, DO  Cardiologist:  No primary care provider on file.  Electrophysiologist:  None   Referring MD: Everrett Coombe, DO   CC: palpitations- chest heaviness  History of Present Illness:    Shirley Carroll is a pleasant 65 y.o. female with a hx of PAF.  This was originally diagnosed in 2016.  She says at that time she was awakened from her sleep with rapid heart rate and shortness of breath.  She converted spontaneously to sinus rhythm.  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  Echocardiogram in 2016 showed normal LV function with grade 2 diastolic dysfunction.  She had a negative treadmill in 2017.  In September 2020 she had CO VID.  Patient is in the office today for routine follow-up.  She says a month ago she had an episode of tachycardia at rest.  She was on the couch watching TV and developed tachycardia associated with chest heaviness "like an elephant sitting on my chest" and shortness of breath.  She took an extra Toprol 25 mg half tablet.  She practiced "deep breathing".  Eventually her symptoms abated but lasted about an hour.  And going about her usual daily activities she denies having any exertional chest tightness or shortness of breath.  She had been walking for exercise but because of the weather has not been doing this much lately.  Past Medical History:  Diagnosis Date  . Allergy   . Atrial fibrillation ([facility])   . Colitis   . Herniated disc   . Herpes zoster 11/07/2011   11/07/2011 right side waistline -T-11 dermatonal distribution >valtrex and steroid taper    . History of headache   . Osteoarthritis   . PONV (postoperative nausea and vomiting)   . Renal calculus   . RENAL CALCULUS, HX OF 05/01/2007   Qualifier: Diagnosis of  By: Yetta Barre CNA/MA, Shanda Bumps    . Vertigo     Past Surgical History:  Procedure Laterality Date  . BACK SURGERY  2004   . COLONOSCOPY    . GYNECOLOGIC CRYOSURGERY  1982  . SHOULDER SURGERY    . VAGINAL HYSTERECTOMY  1991   CIS of the cervix    Current Medications: Current Meds  Medication Sig  . acetaminophen (TYLENOL) 500 MG tablet Take 500 mg by mouth every 6 (six) hours as needed.  Marland Kitchen apixaban (ELIQUIS) 5 MG TABS tablet Take 1 tablet (5 mg total) by mouth 2 (two) times daily.  Marland Kitchen aspirin-acetaminophen-caffeine (EXCEDRIN MIGRAINE) 250-250-65 MG per tablet Take 2 tablets by mouth every 6 (six) hours as needed for headache.  . metoprolol succinate (TOPROL-XL) 25 MG 24 hr tablet TAKE 1 TABLET BY MOUTH EVERY DAY  . [DISCONTINUED] aspirin 81 MG EC tablet Adult Low Dose Aspirin 81 mg tablet,delayed release     Allergies:   Propoxyphene hcl   Social History   Socioeconomic History  . Marital status: Married    Spouse name: Film/video editor  . Number of children: Not on file  . Years of education: Not on file  . Highest education level: Not on file  Occupational History    Employer: [employer]  Tobacco Use  . Smoking status: Never Smoker  . Smokeless tobacco: Never Used  Vaping Use  . Vaping Use: Never used  Substance and Sexual Activity  . Alcohol use: Yes  Alcohol/week: 3.0 standard drinks    Types: 3 Standard drinks or equivalent per week  . Drug use: No  . Sexual activity: Yes    Birth control/protection: Surgical, Implant    Comment: 1st intercourse 65 yo-More than 5 partners  Other Topics Concern  . Not on file  Social History Narrative  . Not on file   Social Determinants of Health   Financial Resource Strain: Not on file  Food Insecurity: Not on file  Transportation Needs: Not on file  Physical Activity: Not on file  Stress: Not on file  Social Connections: Not on file     Family History: The patient's family history includes Atrial fibrillation in her mother; Breast cancer in her maternal aunt; Breast cancer (age of onset: 27) in her maternal aunt. There is no history of  Colon cancer, Colon polyps, Esophageal cancer, Rectal cancer, or Stomach cancer.  ROS:   Please see the history of present illness.   No history of bleeding problems   All other systems reviewed and are negative.  EKGs/Labs/Other Studies Reviewed:    The following studies were reviewed today: Echo 09/02/2014- Impressions:   - Normal LV size and systolic function, EF 55-60%. Moderate  diastolic dysfunction. Normal RV size and systolic function. No  significant valvular abnormalities.   GXT 03/19/2015-  Notes Recorded by Lennette Bihari, MD on 03/25/2015 at 6:28 PM NL GXT  EKG:  EKG is ordered today.  The ekg ordered today demonstrates NSR, HR 69  Recent Labs: No results found for requested labs within last 8760 hours.  Recent Lipid Panel    Component Value Date/Time   CHOL 203 (H) 01/24/2019 1011   TRIG 150.0 (H) 01/24/2019 1011   HDL 49.30 01/24/2019 1011   CHOLHDL 4 01/24/2019 1011   VLDL 30.0 01/24/2019 1011   LDLCALC 124 (H) 01/24/2019 1011    Physical Exam:    VS:  BP 118/80 (BP Location: Left Arm, Patient Position: Sitting)   Pulse 69   Ht 5\' 4"  (1.626 m)   Wt 169 lb 6.4 oz (76.8 kg)   SpO2 97%   BMI 29.08 kg/m     Wt Readings from Last 3 Encounters:  04/27/20 169 lb 6.4 oz (76.8 kg)  06/04/19 173 lb (78.5 kg)  03/21/19 176 lb (79.8 kg)     GEN:  Well nourished, well developed in no acute distress HEENT: Normal NECK: No JVD; No carotid bruits CARDIAC: RRR, no murmurs, rubs, gallops RESPIRATORY:  Clear to auscultation without rales, wheezing or rhonchi  ABDOMEN: Soft, non-tender, non-distended MUSCULOSKELETAL:  No edema; No deformity  SKIN: Warm and dry NEUROLOGIC:  Alert and oriented x 3 PSYCHIATRIC:  Normal affect   ASSESSMENT:    PAF- She has had recurrent episodes of symptomatic PAF.  The last one lasted and hour or more.  She admits she has had other episodes as well. Based on her age she is now a CHADS VASC-2.  I have recommended she stop  aspirin and start Entresto 5 mg BID.  I also encouraged her to continue to take an extra half of Toprol 25 mg as needed.  She could repeat this if needed after 30 minutes.  If her symptoms last an hour or more and she is uncomfortable she really should go to the ER.  Chest pressure- She described substernal chest discomfort "like an elephant sitting on my chest".  This seems to be clearly related to her atrial fibrillation.  She does not have exertional  symptoms besides mild dyspnea with her normal daily activity.  She will let us know if she develops any exertional chest discomfort that is not clearly related to her atrial fibrillation.  Diastolic dysfunction- Blood pressures controlled and she has no signs or symptoms of congestive heart failure. PLAN:    Check CBC and BMP. Stop aspirin- start Eliquis 5 mg BID.  F/U Dr Tresa Endo in 6 months.    Medication Adjustments/Labs and Tests Ordered: Current medicines are reviewed at length with the patient today.  Concerns regarding medicines are outlined above.  Orders Placed This Encounter  Procedures  . CBC  . Basic Metabolic Panel (BMET)  . EKG 12-Lead   Meds ordered this encounter  Medications  . apixaban (ELIQUIS) 5 MG TABS tablet    Sig: Take 1 tablet (5 mg total) by mouth 2 (two) times daily.    Dispense:  60 tablet    Refill:  6    Patient Instructions  Medication Instructions:  STOP- Aspirin  START- Eliquis 5 mg by mouth twice a day  *If you need a refill on your cardiac medications before your next appointment, please call your pharmacy*   Lab Work: CBC and BMP  Testing/Procedures: None Ordered   Follow-Up: At [facility], you and your health needs are our priority.  As part of our continuing mission to provide you with exceptional heart care, we have created designated Provider Care Teams.  These Care Teams include your primary Cardiologist (physician) and Advanced Practice Providers (APPs -  Physician Assistants and  Nurse Practitioners) who all work together to provide you with the care you need, when you need it.  We recommend signing up for the patient portal called "MyChart".  Sign up information is provided on this After Visit Summary.  MyChart is used to connect with patients for Virtual Visits (Telemedicine).  Patients are able to view lab/test results, encounter notes, upcoming appointments, etc.  Non-urgent messages can be sent to your provider as well.   To learn more about what you can do with MyChart, go to [email or web address].    Your next appointment:   6 month(s)  The format for your next appointment:   In Person  Provider:   You may see Nicki Guadalajara, MD or one of the following Advanced Practice Providers on your designated Care Team:    Azalee Course, PA-C  Micah Flesher, PA-C or   Judy Pimple, PA-C       Signed, Corine Shelter, [place]  04/27/2020 9:36 AM    [facility]

## 2020-04-28 LAB — CBC
Hematocrit: 37.1 % (ref 34.0–46.6)
Hemoglobin: 12.7 g/dL (ref 11.1–15.9)
MCH: 31.1 pg (ref 26.6–33.0)
MCHC: 34.2 g/dL (ref 31.5–35.7)
MCV: 91 fL (ref 79–97)
Platelets: 259 10*3/uL (ref 150–450)
RBC: 4.08 x10E6/uL (ref 3.77–5.28)
RDW: 11.7 % (ref 11.7–15.4)
WBC: 4.9 10*3/uL (ref 3.4–10.8)

## 2020-04-28 LAB — BASIC METABOLIC PANEL
BUN/Creatinine Ratio: 22 (ref 12–28)
BUN: 16 mg/dL (ref 8–27)
CO2: 25 mmol/L (ref 20–29)
Calcium: 9.3 mg/dL (ref 8.7–10.3)
Chloride: 105 mmol/L (ref 96–106)
Creatinine, Ser: 0.74 mg/dL (ref 0.57–1.00)
GFR calc Af Amer: 98 mL/min/{1.73_m2} (ref >59)
GFR calc non Af Amer: 85 mL/min/{1.73_m2} (ref >59)
Glucose: 107 mg/dL — ABNORMAL HIGH (ref 65–99)
Potassium: 4.8 mmol/L (ref 3.5–5.2)
Sodium: 143 mmol/L (ref 134–144)

## 2020-05-12 ENCOUNTER — Other Ambulatory Visit: Payer: Self-pay | Admitting: *Deleted

## 2020-05-12 ENCOUNTER — Telehealth: Payer: Self-pay

## 2020-05-12 MED ORDER — RIVAROXABAN 20 MG PO TABS: 20.0000 mg | ORAL_TABLET | Freq: Every day | ORAL | 6 refills | Status: DC

## 2020-05-12 NOTE — Telephone Encounter (Signed)
Pt insurance did not cover pt Eliquis, Eliquis switch to Xarelto 20 mg by mouth daily. Rx has been sent to the pharmacy electronically.

## 2020-05-12 NOTE — Telephone Encounter (Signed)
Eliquis PA stated through covermymeds. Message received: The patient's drug benefit plan provides coverage for other drugs which may be considered for treating your patient. Can your patient's treatment be switched to a formulary drug? [If yes, then provide your patient with a new prescription for the preferred product.] Available Formulary Alternatives: Xarelto  Will forward to [facility], PA-c and NL Triage for advisement to the pt.

## 2020-05-12 NOTE — Telephone Encounter (Signed)
Prior authorization for Eliquis 5 mg received from patient's insurance company via [email or web address].  CMM Key: [facility] Message sent to PA department for processing.

## 2020-05-13 NOTE — Telephone Encounter (Signed)
Yes pt made aware of medication changes

## 2020-05-13 NOTE — Telephone Encounter (Signed)
Nya I think we already changed her to XareltoCorine Shelter PA-C 05/13/2020 9:40 AM

## 2020-05-14 ENCOUNTER — Encounter: Payer: Self-pay | Admitting: *Deleted

## 2020-06-08 ENCOUNTER — Other Ambulatory Visit: Payer: Self-pay | Admitting: Cardiovascular Disease

## 2020-11-09 ENCOUNTER — Telehealth: Payer: Self-pay | Admitting: Cardiovascular Disease

## 2020-11-09 NOTE — Telephone Encounter (Signed)
Patient c/o Palpitations:  High priority if patient c/o lightheadedness, shortness of breath, or chest pain  How long have you had palpitations/irregular HR/ Afib? Are you having the symptoms now? Afib episode on Saturday  Are you currently experiencing lightheadedness, SOB or CP?   Do you have a history of afib (atrial fibrillation) or irregular heart rhythm? Yes  Have you checked your BP or HR? (document readings if available):   Are you experiencing any other symptoms? No- I made an appointment for tomorrow(11-10-20) with Judy Pimple- please call to evaluate

## 2020-11-09 NOTE — Progress Notes (Signed)
Cardiology Office Note   Date:  11/10/2020   ID:  Shirley, Carroll May 16, 1955, MRN [id number]  PCP:  Everrett Coombe, DO  Cardiologist:  Nicki Guadalajara, MD EP: None  Chief Complaint  Patient presents with   Atrial Fibrillation       History of Present Illness: Shirley Carroll is a 65 y.o. female with a PMH of paroxysmal atrial fibrillation, who presents for evaluation of recent atrial fibrillation  She was last evaluated by cardiology at an outpatient visit with Shirley Shelter, PA-C 04/2020 at which time she reported an episode of tachypalpitations at rest ~1 month prior to this visit. She reported associated chest heaviness and SOB. She took an additional metoprolol succinate 12.5mg  and symptoms ultimately resolved after ~1 hour. She had no exertional chest pain complaints at that time. Given concern for recurrent atrial fibrillation since her initial diagnosis in 2016 she was recommended to start eliquis 5mg  BID given CHA2DS2-VASc Score of 2 (age and female) and to follow-up with Dr. in 6 months. She was ultimately transitioned to xarelto which was covered by her insurance. She had an echocardiogram in 2016 which showed EF 55-60%, G2DD, no RWMA, and no significant valvular abnormalities. Her last ischemic evaluation was an ETT in 2017 which was without ischemic EKG changes.   She subsequently contacted our office 11/09/20 to report a 4 hour episode of atrial fibrillation on 11/07/20. She was noted to have not started xarelto as previously recommended due to some confusion. She was scheduled for this visit   She presents today for recent atrial fibrillation. She had an episode of atrial fibrillation which woke her from sleep 11/07/20. She felt chest pressure and palpitations with this episode which is classic for her atrial fibrillation, though did have some radiation of pain into her left arm with this episode. She reported HR in the 110s-120s. BP was also elevated at that time according  to a wrist BP cuff. She took an additional 1/2 tab of metoprolol succinate x2 doses 2 hours apart and ultimately went to sleep 4 hours after onset. When she woke up 6 hours later, she felt she was back in a normal heart rhythm. She had fatigue throughout the day Sunday but felt back to her usual self since then. She reported prior to this her last episode was ~3 months ago and lasted for ~3 hours. She feels the frequency and duration of Afib episodes has increased slightly in the past year but still occurring fairly infrequently. She has not had any exertional chest pain outside of her Afib episodes. Has occasional DOE which is unchanged. No complaints of SOB at rest, orthopnea, PND, LE edema, dizziness, lightheadedness, or syncope. She does not describe symptoms c/f OSA. She has not had any recent illnesses. She has been under increased stress over the past couple months - work and personal. Her mother passed away last month following a CVA. She did not start xarelto after her last visit to confusion about prescriptions though is agreeable to starting now. Importance of compliance stressed.     Past Medical History:  Diagnosis Date   Allergy    Atrial fibrillation (HCC)    Colitis    Herniated disc    Herpes zoster 11/07/2011   11/07/2011 right side waistline -T-11 dermatonal distribution >valtrex and steroid taper     History of headache    Osteoarthritis    PONV (postoperative nausea and vomiting)    Renal calculus    RENAL  CALCULUS, HX OF 05/01/2007   Qualifier: Diagnosis of  By: Yetta Barre CNA/MA, Jessica     Vertigo     Past Surgical History:  Procedure Laterality Date   BACK SURGERY  2004   COLONOSCOPY     GYNECOLOGIC CRYOSURGERY  1982   SHOULDER SURGERY     VAGINAL HYSTERECTOMY  1991   CIS of the cervix     Current Outpatient Medications  Medication Sig Dispense Refill   acetaminophen (TYLENOL) 500 MG tablet Take 500 mg by mouth every 6 (six) hours as needed.      aspirin-acetaminophen-caffeine (EXCEDRIN MIGRAINE) 250-250-65 MG per tablet Take 2 tablets by mouth every 6 (six) hours as needed for headache.     metoprolol succinate (TOPROL-XL) 25 MG 24 hr tablet TAKE 1 TABLET BY MOUTH EVERY DAY 90 tablet 3   metoprolol tartrate (LOPRESSOR) 50 MG tablet Take 1 tablet (50 mg total) by mouth 2 (two) times daily. To Take Day of CT Scan 1-2 hours prior 1 tablet 0   rivaroxaban (XARELTO) 20 MG TABS tablet Take 1 tablet (20 mg total) by mouth daily with supper. 90 tablet 3   No current facility-administered medications for this visit.    Allergies:   Propoxyphene hcl    Social History:  The patient  reports that she has never smoked. She has never used smokeless tobacco. She reports current alcohol use of about 3.0 standard drinks per week. She reports that she does not use drugs.   Family History:  The patient's family history includes Atrial fibrillation in her mother; Breast cancer in her maternal aunt; Breast cancer (age of onset: 8) in her maternal aunt.    ROS:  Please see the history of present illness.   Otherwise, review of systems are positive for none.   All other systems are reviewed and negative.    PHYSICAL EXAM: VS:  BP 128/82   Pulse 65   Ht 5\' 4"  (1.626 m)   Wt 167 lb (75.8 kg)   SpO2 96%   BMI 28.67 kg/m  , BMI Body mass index is 28.67 kg/m. GEN: Well nourished, well developed, in no acute distress HEENT: sclera anicteric Neck: no JVD, carotid bruits, or masses Cardiac: RRR; no murmurs, rubs, or gallops,no edema  Respiratory:  clear to auscultation bilaterally, normal work of breathing GI: soft, nontender, nondistended, + BS MS: no deformity or atrophy Skin: warm and dry, no rash Neuro:  Strength and sensation are intact Psych: euthymic mood, full affect   EKG:  EKG is ordered today. The ekg ordered today demonstrates sinus rhythm, rate 65 bpm, non-specific T wave abnormalities, no STE/D' overall stable from previous.     Recent Labs: 04/28/2020: BUN 16; Creatinine, Ser 0.74; Hemoglobin 12.7; Platelets 259; Potassium 4.8; Sodium 143    Lipid Panel    Component Value Date/Time   CHOL 203 (H) 01/24/2019 1011   TRIG 150.0 (H) 01/24/2019 1011   HDL 49.30 01/24/2019 1011   CHOLHDL 4 01/24/2019 1011   VLDL 30.0 01/24/2019 1011   LDLCALC 124 (H) 01/24/2019 1011      Wt Readings from Last 3 Encounters:  11/10/20 167 lb (75.8 kg)  04/27/20 169 lb 6.4 oz (76.8 kg)  06/04/19 173 lb (78.5 kg)      Other studies Reviewed: Additional studies/ records that were reviewed today include:   Echocardiogram 2016: - Left ventricle: The cavity size was normal. Wall thickness was    normal. Systolic function was normal. The estimated ejection  fraction was in the range of 55% to 60%. Wall motion was normal;    there were no regional wall motion abnormalities. Features are    consistent with a pseudonormal left ventricular filling pattern,    with concomitant abnormal relaxation and increased filling    pressure (grade 2 diastolic dysfunction).  - Aortic valve: There was no stenosis.  - Mitral valve: There was trivial regurgitation.  - Right ventricle: The cavity size was normal. Systolic function    was normal.  - Tricuspid valve: Peak RV-RA gradient (S): 24 mm Hg.  - Pulmonary arteries: PA peak pressure: 32 mm Hg (S).  - Systemic veins: IVC measured 1.9 cm with < 50% respirophasic    variation, suggesting RA pressure 8 mmHg.   Impressions:   - Normal LV size and systolic function, EF 55-60%. Moderate    diastolic dysfunction. Normal RV size and systolic function. No    significant valvular abnormalities.   Exercise tolerance test 2017: There was no ST segment deviation noted during stress.   ETT with good exercise tolerance (9:00); no chest pain; normal BP response; no ST changes; negative adequate ETT.  ASSESSMENT AND PLAN:  1. Paroxysmal atrial fibrillation: has had several episode of afib  since 04/2020, most recently a 4 hour episode of tachypalpitations 11/07/20. EKG today with sinus rhythm today. She had not been taking xarelto as prescribed earlier this year. We discussed risks/benefits today - Will check a coronary CTA to ensure no ischemia is contributing to her Afib with associated chest pressure - Will check TSH today to ensure no change from 2020 - Continue xarelto for stroke ppx - Continue metoprolol succinate daily with addition 1/2 tab prn palpitations - Could consider referral to EP for antiarrhythmic vs ablation consideration should episodes continue to increase in frequency  2. Chest pain: patient reports episode of chest pain/pressure during atrial fibrillation episodes. No exertional chest pain complaints. Has chronic DOE which is unchanged. EKG today showed non-specific T wave abnormalities but no STE/D. Her last ischemic evaluation was an ETT in 2017.  - Will check a coronary CTA as above to r/o ischemia   Current medicines are reviewed at length with the patient today.  The patient does not have concerns regarding medicines.  The following changes have been made:  As above  Labs/ tests ordered today include:   Orders Placed This Encounter  Procedures   CT CORONARY MORPH W/CTA COR W/SCORE W/CA W/CM &/OR WO/CM   TSH   Basic metabolic panel   EKG 12-Lead      Disposition:   FU with myself or Dr. Tresa Endo in 3 months  Signed, Beatriz Stallion, PA-C  11/10/2020 1:33 PM

## 2020-11-09 NOTE — Telephone Encounter (Signed)
Spoke with pt and had episode on Saturday of afib this lasted 4 hours Per pt has had 2 episodes since March Pt never did start Xarelto some confusion on starting med Pt has appt tomorrow .Shirley Carroll

## 2020-11-10 ENCOUNTER — Other Ambulatory Visit: Payer: Self-pay

## 2020-11-10 ENCOUNTER — Encounter: Payer: Self-pay | Admitting: Medical

## 2020-11-10 ENCOUNTER — Ambulatory Visit: Payer: No Typology Code available for payment source | Admitting: Medical

## 2020-11-10 VITALS — BP 128/82 | HR 65 | Ht 64.0 in | Wt 167.0 lb

## 2020-11-10 DIAGNOSIS — I480 Paroxysmal atrial fibrillation: Secondary | ICD-10-CM

## 2020-11-10 MED ORDER — METOPROLOL TARTRATE 50 MG PO TABS: 50.0000 mg | ORAL_TABLET | Freq: Two times a day (BID) | ORAL | 0 refills | Status: DC

## 2020-11-10 MED ORDER — RIVAROXABAN 20 MG PO TABS: 20.0000 mg | ORAL_TABLET | Freq: Every day | ORAL | 3 refills | Status: DC

## 2020-11-10 NOTE — Patient Instructions (Signed)
Medication Instructions:  Start Xarelto 20 mg ( 1 Tablet Daily with Super.) *If you need a refill on your cardiac medications before your next appointment, please call your pharmacy*   Lab Work: BMET, TSH : Today If you have labs (blood work) drawn today and your tests are completely normal, you will receive your results only by: MyChart Message (if you have MyChart) OR A paper copy in the mail If you have any lab test that is abnormal or we need to change your treatment, we will call you to review the results.   Testing/Procedures: [facility], [address]. Your physician has requested that you have cardiac CT. Cardiac computed tomography (CT) is a painless test that uses an x-ray machine to take clear, detailed pictures of your heart. For further information please visit [email or web address] Please follow instruction sheet as given.     Follow-Up: At [facility], you and your health needs are our priority.  As part of our continuing mission to provide you with exceptional heart care, we have created designated Provider Care Teams.  These Care Teams include your primary Cardiologist (physician) and Advanced Practice Providers (APPs -  Physician Assistants and Nurse Practitioners) who all work together to provide you with the care you need, when you need it.  Your next appointment:   3 month(s)  The format for your next appointment:   In Person  Provider:   Nicki Guadalajara, MD , Judy Pimple, PA-C

## 2020-11-11 ENCOUNTER — Encounter: Payer: Self-pay | Admitting: Family Medicine

## 2020-11-11 LAB — TSH: TSH: 1.82 u[IU]/mL (ref 0.450–4.500)

## 2020-11-11 LAB — BASIC METABOLIC PANEL
BUN/Creatinine Ratio: 22 (ref 12–28)
BUN: 16 mg/dL (ref 8–27)
CO2: 23 mmol/L (ref 20–29)
Calcium: 10 mg/dL (ref 8.7–10.3)
Chloride: 99 mmol/L (ref 96–106)
Creatinine, Ser: 0.72 mg/dL (ref 0.57–1.00)
Glucose: 96 mg/dL (ref 65–99)
Potassium: 5.2 mmol/L (ref 3.5–5.2)
Sodium: 142 mmol/L (ref 134–144)
eGFR: 92 mL/min/{1.73_m2} (ref >59)

## 2020-11-17 ENCOUNTER — Telehealth (HOSPITAL_COMMUNITY): Payer: Self-pay | Admitting: Emergency Medicine

## 2020-11-17 NOTE — Telephone Encounter (Signed)
Attempted to call patient regarding upcoming cardiac CT appointment. °Left message on voicemail with name and callback number °Mahathi Pokorney RN Navigator Cardiac Imaging °[facility] °[phone] Office °[phone] Cell ° °

## 2020-11-18 ENCOUNTER — Ambulatory Visit (HOSPITAL_COMMUNITY)
Admission: RE | Admit: 2020-11-18 | Discharge: 2020-11-18 | Disposition: A | Discharge status: Home / Self Care | Payer: No Typology Code available for payment source | Source: Ambulatory Visit | Attending: Medical | Admitting: Medical

## 2020-11-18 ENCOUNTER — Other Ambulatory Visit: Payer: Self-pay

## 2020-11-18 DIAGNOSIS — I700 Atherosclerosis of aorta: Secondary | ICD-10-CM

## 2020-11-18 DIAGNOSIS — I480 Paroxysmal atrial fibrillation: Secondary | ICD-10-CM | POA: Diagnosis not present

## 2020-11-18 MED ORDER — IOHEXOL 350 MG/ML SOLN
80.0000 mL | Freq: Once | INTRAVENOUS | Status: AC | PRN
  Administered 2020-11-18: 80 mL via INTRAVENOUS

## 2020-11-18 MED ORDER — NITROGLYCERIN 0.4 MG SL SUBL
SUBLINGUAL_TABLET | SUBLINGUAL | Status: AC
  Filled 2020-11-18: qty 2

## 2020-11-18 MED ORDER — NITROGLYCERIN 0.4 MG SL SUBL
0.8000 mg | SUBLINGUAL_TABLET | Freq: Once | SUBLINGUAL | Status: AC
  Administered 2020-11-18: 0.8 mg via SUBLINGUAL

## 2020-11-25 DIAGNOSIS — M21619 Bunion of unspecified foot: Secondary | ICD-10-CM | POA: Insufficient documentation

## 2021-02-02 ENCOUNTER — Other Ambulatory Visit: Payer: Self-pay

## 2021-02-02 ENCOUNTER — Ambulatory Visit (HOSPITAL_COMMUNITY)
Admission: EM | Admit: 2021-02-02 | Discharge: 2021-02-02 | Disposition: A | Discharge status: Home / Self Care | Payer: No Typology Code available for payment source | Attending: Urgent Care | Admitting: Urgent Care

## 2021-02-02 ENCOUNTER — Encounter (HOSPITAL_COMMUNITY): Payer: Self-pay

## 2021-02-02 DIAGNOSIS — R42 Dizziness and giddiness: Secondary | ICD-10-CM

## 2021-02-02 MED ORDER — MECLIZINE HCL 12.5 MG PO TABS: 12.5000 mg | ORAL_TABLET | Freq: Three times a day (TID) | ORAL | 0 refills | Status: DC | PRN

## 2021-02-02 NOTE — ED Triage Notes (Signed)
Patient presents to urgent care for dizziness and nausea,this started 2-3 days ago.

## 2021-02-02 NOTE — ED Provider Notes (Signed)
[place]   MRN: [id number] DOB: 1955/06/30  Subjective:   Shirley Carroll is a 65 y.o. female presenting for 2 to 3-day history of acute onset progressive persistent dizziness, vertigo.  Patient has longstanding history of this.  Is worse with bending down, laying down.  Cannot recall any particular inciting events.  She has had some posterior head pressure but not pain.  Also some slight nausea.  No confusion, headache, vision change, weakness, numbness or tingling, chest pain, heart racing or palpitations.  She does have a history of atrial fibrillation and is on metoprolol and Xarelto for this.  Has not taken any particular medications for her dizziness.  She is under a lot of stress, her husband is in the hospital.  No history of stroke, TIA.  No current facility-administered medications for this encounter.  Current Outpatient Medications:    acetaminophen (TYLENOL) 500 MG tablet, Take 500 mg by mouth every 6 (six) hours as needed., Disp: , Rfl:    aspirin-acetaminophen-caffeine (EXCEDRIN MIGRAINE) 250-250-65 MG per tablet, Take 2 tablets by mouth every 6 (six) hours as needed for headache., Disp: , Rfl:    metoprolol succinate (TOPROL-XL) 25 MG 24 hr tablet, TAKE 1 TABLET BY MOUTH EVERY DAY, Disp: 90 tablet, Rfl: 3   metoprolol tartrate (LOPRESSOR) 50 MG tablet, Take 1 tablet (50 mg total) by mouth 2 (two) times daily. To Take Day of CT Scan 1-2 hours prior, Disp: 1 tablet, Rfl: 0   rivaroxaban (XARELTO) 20 MG TABS tablet, Take 1 tablet (20 mg total) by mouth daily with supper., Disp: 90 tablet, Rfl: 3   Allergies  Allergen Reactions   Propoxyphene Hcl     REACTION: extreme nausea  Darvon    Past Medical History:  Diagnosis Date   Allergy    Atrial fibrillation ([place])    Colitis    Herniated disc    Herpes zoster 11/07/2011   11/07/2011 right side waistline -T-11 dermatonal distribution >valtrex and steroid taper     History of headache    Osteoarthritis     PONV (postoperative nausea and vomiting)    Renal calculus    RENAL CALCULUS, HX OF 05/01/2007   Qualifier: Diagnosis of  By: Ronnald Ramp CNA/MA, Jessica     Vertigo      Past Surgical History:  Procedure Laterality Date   BACK SURGERY  2004   COLONOSCOPY     GYNECOLOGIC CRYOSURGERY  1982   SHOULDER SURGERY     VAGINAL HYSTERECTOMY  1991   CIS of the cervix    Family History  Problem Relation Age of Onset   Atrial fibrillation Mother    Breast cancer Maternal Aunt        mid 54's   Breast cancer Maternal Aunt 35   Colon cancer Neg Hx    Colon polyps Neg Hx    Esophageal cancer Neg Hx    Rectal cancer Neg Hx    Stomach cancer Neg Hx     Social History   Tobacco Use   Smoking status: Never   Smokeless tobacco: Never  Vaping Use   Vaping Use: Never used  Substance Use Topics   Alcohol use: Yes    Alcohol/week: 3.0 standard drinks    Types: 3 Standard drinks or equivalent per week   Drug use: No    ROS   Objective:   Vitals: BP 131/63 (BP Location: Left Arm)   Pulse 100   Temp 97.9 F (36.6 C) (  Oral)   SpO2 100%   Physical Exam Constitutional:      General: She is not in acute distress.    Appearance: Normal appearance. She is well-developed. She is not ill-appearing, toxic-appearing or diaphoretic.  HENT:     Head: Normocephalic and atraumatic.     Right Ear: Tympanic membrane, ear canal and external ear normal. No drainage or tenderness. No middle ear effusion. There is no impacted cerumen. Tympanic membrane is not erythematous.     Left Ear: Tympanic membrane, ear canal and external ear normal. No drainage or tenderness.  No middle ear effusion. There is no impacted cerumen. Tympanic membrane is not erythematous.     Nose: Nose normal. No congestion or rhinorrhea.     Mouth/Throat:     Mouth: Mucous membranes are moist. No oral lesions.     Pharynx: Oropharynx is clear. No pharyngeal swelling, oropharyngeal exudate, posterior oropharyngeal erythema or uvula  swelling.     Tonsils: No tonsillar exudate or tonsillar abscesses.  Eyes:     Extraocular Movements: Extraocular movements intact.     Right eye: Normal extraocular motion.     Left eye: Normal extraocular motion.     Conjunctiva/sclera: Conjunctivae normal.     Pupils: Pupils are equal, round, and reactive to light.  Cardiovascular:     Rate and Rhythm: Normal rate and regular rhythm.     Pulses: Normal pulses.     Heart sounds: Normal heart sounds. No murmur heard.   No friction rub. No gallop.  Pulmonary:     Effort: Pulmonary effort is normal. No respiratory distress.     Breath sounds: Normal breath sounds. No stridor. No wheezing, rhonchi or rales.  Musculoskeletal:     Cervical back: Normal range of motion and neck supple.  Lymphadenopathy:     Cervical: No cervical adenopathy.  Skin:    General: Skin is warm and dry.     Findings: No rash.  Neurological:     General: No focal deficit present.     Mental Status: She is alert and oriented to person, place, and time.     Cranial Nerves: No cranial nerve deficit.     Motor: No weakness.     Coordination: Coordination normal.     Gait: Gait normal.     Deep Tendon Reflexes: Reflexes normal.     Comments: Negative Romberg and pronator drift, no facial asymmetry.  Psychiatric:        Mood and Affect: Mood normal.        Behavior: Behavior normal.        Thought Content: Thought content normal.    ED ECG REPORT   Date: 02/02/2021  Rate: 63bpm  Rhythm: normal sinus rhythm  QRS Axis: normal  Intervals: normal  ST/T Wave abnormalities: normal  Conduction Disutrbances:none  Narrative Interpretation: Sinus rhythm at 63 bpm with nonspecific T wave flattening in lead III.  Very comparable to previous EKG.  Old EKG Reviewed: unchanged  I have personally reviewed the EKG tracing and agree with the computerized printout as noted.   Assessment and Plan :   PDMP not reviewed this encounter.  1. Vertigo   2. Dizziness     Patient has reassuring EKG, neurologic exam.  She prefers not to go to the ER for head CT to rule out an acute encephalopathy.  Would like to trial meclizine.  Counseled on general management of vertigo. Counseled patient on potential for adverse effects with medications prescribed/recommended today, ER and  return-to-clinic precautions discussed, patient verbalized understanding.    Wallis Bamberg, PA-C 02/02/21 1213

## 2021-02-08 NOTE — Progress Notes (Deleted)
Cardiology Clinic Note   Patient Name: Shirley Carroll Date of Encounter: 02/08/2021  Primary Care Provider:  Luetta Nutting, DO Primary Cardiologist:  Shelva Majestic, MD  Patient Profile    Shirley Carroll 65 year old female presents the clinic today for follow-up evaluation of her paroxysmal atrial fibrillation.  Past Medical History    Past Medical History:  Diagnosis Date   Allergy    Atrial fibrillation ([place])    Colitis    Herniated disc    Herpes zoster 11/07/2011   11/07/2011 right side waistline -T-11 dermatonal distribution >valtrex and steroid taper     History of headache    Osteoarthritis    PONV (postoperative nausea and vomiting)    Renal calculus    RENAL CALCULUS, HX OF 05/01/2007   Qualifier: Diagnosis of  By: Ronnald Ramp CNA/MA, Jessica     Vertigo    Past Surgical History:  Procedure Laterality Date   BACK SURGERY  2004   COLONOSCOPY     GYNECOLOGIC CRYOSURGERY  1982   SHOULDER SURGERY     VAGINAL HYSTERECTOMY  1991   CIS of the cervix    Allergies  Allergies  Allergen Reactions   Propoxyphene Hcl     REACTION: extreme nausea  [place]    History of Present Illness    Shirley Carroll has a PMH of paroxysmal atrial fibrillation, vertigo, osteoarthritis, dysplasia, migraine headaches, fatigue, and diastolic dysfunction.  She was seen and evaluated by leuko-Roy PA-C on 2/22.  At that time she reported episodes of tachypalpitations while at rest for about 1 month.  She reported associated heaviness in her chest and shortness of breath.  She took additional metoprolol succinate 12.5 mg and symptoms resolved after about an hour.  She reported no exertional chest discomfort at that time.  Due to her recurrent atrial fibrillation and since the time of her initial diagnosis in 2016 it was recommended that she start on apixaban 5 mg twice daily.  CHA2DS2-VASc score 2 age and gender.  She was scheduled to follow-up with Dr. Claiborne Billings in 6 months.  She ultimately  transition to [place] which was covered by her insurance.  She had an echocardiogram in 2016 which showed an EF of 55-60%, G2 DD, no regional wall motion abnormalities, no significant valvular abnormalities.  She had an exercise treadmill test 2017 which showed no ischemic changes.  She was seen by Roby Lofts, PA-C 11/10/2020.  During that time she reported an episode of atrial fibrillation which woke her from her sleep at 11/07/2020.  She felt chest pressure and palpitations with the episode which were classic symptoms for her and her atrial fibrillation.  She did note some radiation to her left arm with this episode.  Her heart rate was in the 110-120 range.  She also noted elevated blood pressure at the time which was taken by a wrist blood pressure cuff.  She took an additional half tablet of her metoprolol succinate x2 2 hours apart and converted back to her normal heart rhythm.  She had fatigue throughout the day but felt back to her usual self after that period of time.  She reported prior to the episode her previous episode of atrial fibrillation was 3 months prior and lasted for 3 hours.  She felt that her frequency and duration of atrial fibrillation episodes had increased in the past year.  She denied exertional chest pain outside of her atrial fibrillation episodes.  She noted occasional DOE which was unchanged.  She  denied orthopnea, PND, lower extremity swelling, dizziness, lightheadedness, and syncope.  She denied symptoms of OSA.  She denied recent illnesses.  She had been under increased stress for the past couple months related to work in her personal life.  Her mother passed away last month following CVA.  She had not started her Xarelto after her last clinic visit due to confusion about prescriptions however, she was agreeable to starting medication at that time.  The importance of compliance was reviewed.  She presents to the clinic today for follow-up evaluation states***  *** denies  chest pain, shortness of breath, lower extremity edema, fatigue, palpitations, melena, hematuria, hemoptysis, diaphoresis, weakness, presyncope, syncope, orthopnea, and PND.   Home Medications    Prior to Admission medications   Medication Sig Start Date End Date Taking? Authorizing Provider  acetaminophen (TYLENOL) 500 MG tablet Take 500 mg by mouth every 6 (six) hours as needed.    [provider]  aspirin-acetaminophen-caffeine (EXCEDRIN MIGRAINE) [phone] MG per tablet Take 2 tablets by mouth every 6 (six) hours as needed for headache.    [provider]  meclizine (ANTIVERT) 12.5 MG tablet Take 1 tablet (12.5 mg total) by mouth 3 (three) times daily as needed for dizziness. 02/02/21   Jaynee Eagles, PA-C  metoprolol succinate (TOPROL-XL) 25 MG 24 hr tablet TAKE 1 TABLET BY MOUTH EVERY DAY 06/08/20   [name], MD  metoprolol tartrate (LOPRESSOR) 50 MG tablet Take 1 tablet (50 mg total) by mouth 2 (two) times daily. To Take Day of CT Scan 1-2 hours prior 11/10/20 02/08/21  [name], PA-C  rivaroxaban (XARELTO) 20 MG TABS tablet Take 1 tablet (20 mg total) by mouth daily with supper. 11/10/20   Kroeger, Lorelee Cover., PA-C    Family History    Family History  Problem Relation Age of Onset   Atrial fibrillation Mother    Breast cancer Maternal Aunt        mid 46's   Breast cancer Maternal Aunt 35   Colon cancer Neg Hx    Colon polyps Neg Hx    Esophageal cancer Neg Hx    Rectal cancer Neg Hx    Stomach cancer Neg Hx    She indicated that her mother is alive. She indicated that her father is alive. She indicated that the status of her neg hx is unknown.  Social History    Social History   Socioeconomic History   Marital status: Married    Spouse name: Photographer   Number of children: Not on file   Years of education: Not on file   Highest education level: Not on file  Occupational History    Employer: [employer]  Tobacco Use   Smoking status:  Never   Smokeless tobacco: Never  Vaping Use   Vaping Use: Never used  Substance and Sexual Activity   Alcohol use: Yes    Alcohol/week: 3.0 standard drinks    Types: 3 Standard drinks or equivalent per week   Drug use: No   Sexual activity: Yes    Birth control/protection: Surgical, Implant    Comment: 1st intercourse 65 yo-More than 5 partners  Other Topics Concern   Not on file  Social History Narrative   Not on file   Social Determinants of Health   Financial Resource Strain: Not on file  Food Insecurity: Not on file  Transportation Needs: Not on file  Physical Activity: Not on file  Stress: Not on file  Social Connections:  Not on file  Intimate Partner Violence: Not on file     Review of Systems    General:  No chills, fever, night sweats or weight changes.  Cardiovascular:  No chest pain, dyspnea on exertion, edema, orthopnea, palpitations, paroxysmal nocturnal dyspnea. Dermatological: No rash, lesions/masses Respiratory: No cough, dyspnea Urologic: No hematuria, dysuria Abdominal:   No nausea, vomiting, diarrhea, bright red blood per rectum, melena, or hematemesis Neurologic:  No visual changes, wkns, changes in mental status. All other systems reviewed and are otherwise negative except as noted above.  Physical Exam    VS:  There were no vitals taken for this visit. , BMI There is no height or weight on file to calculate BMI. GEN: Well nourished, well developed, in no acute distress. HEENT: normal. Neck: Supple, no JVD, carotid bruits, or masses. Cardiac: RRR, no murmurs, rubs, or gallops. No clubbing, cyanosis, edema.  Radials/DP/PT 2+ and equal bilaterally.  Respiratory:  Respirations regular and unlabored, clear to auscultation bilaterally. GI: Soft, nontender, nondistended, BS + x 4. MS: no deformity or atrophy. Skin: warm and dry, no rash. Neuro:  Strength and sensation are intact. Psych: Normal affect.  Accessory Clinical Findings    Recent  Labs: 04/28/2020: Hemoglobin 12.7; Platelets 259 11/10/2020: BUN 16; Creatinine, Ser 0.72; Potassium 5.2; Sodium 142; TSH 1.820   Recent Lipid Panel    Component Value Date/Time   CHOL 203 (H) 01/24/2019 1011   TRIG 150.0 (H) 01/24/2019 1011   HDL 49.30 01/24/2019 1011   CHOLHDL 4 01/24/2019 1011   VLDL 30.0 01/24/2019 1011   LDLCALC 124 (H) 01/24/2019 1011    ECG personally reviewed by me today- *** - No acute changes  Echocardiogram 09/02/2014 Study Conclusions   - Left ventricle: The cavity size was normal. Wall thickness was    normal. Systolic function was normal. The estimated ejection    fraction was in the range of 55% to 60%. Wall motion was normal;    there were no regional wall motion abnormalities. Features are    consistent with a pseudonormal left ventricular filling pattern,    with concomitant abnormal relaxation and increased filling    pressure (grade 2 diastolic dysfunction).  - Aortic valve: There was no stenosis.  - Mitral valve: There was trivial regurgitation.  - Right ventricle: The cavity size was normal. Systolic function    was normal.  - Tricuspid valve: Peak RV-RA gradient (S): 24 mm Hg.  - Pulmonary arteries: PA peak pressure: 32 mm Hg (S).  - Systemic veins: IVC measured 1.9 cm with < 50% respirophasic    variation, suggesting RA pressure 8 mmHg.   Impressions:   - Normal LV size and systolic function, EF 55-60%. Moderate    diastolic dysfunction. Normal RV size and systolic function. No    significant valvular abnormalities.   Exercise tolerance test 03/19/2015  There was no ST segment deviation noted during stress.   ETT with good exercise tolerance (9:00); no chest pain; normal BP response; no ST changes; negative adequate ETT.  Coronary CTA 11/18/2020 IMPRESSION: 1. Coronary calcium score of 1.68. This was 51st percentile for age-, race-, and sex-matched controls.   2. Normal coronary origin with right dominance.   3. No evidence of  obstructive CAD. Minimal (<25%) calcified plaque in the LM. CAD RADS 1.   Chilton Si, MD     Electronically Signed   By: Chilton Si M.D.   On: 11/18/2020 17:28  Assessment & Plan   1.  Paroxysmal atrial fibrillation-no recent episodes of accelerated or irregular heartbeat.  Denies chest heaviness and DOE.  Reports compliance of her Xarelto and denies bleeding issues.  Heart rate today***.  Previously discussed referral to EP for discussion of antiarrhythmic versus ablation if increase in A. fib episodes/frequency. Continue metoprolol, Xarelto Heart healthy low-sodium diet-salty 6 given Increase physical activity as tolerated May continue to take an extra half tablet of metoprolol as needed daily for palpitations  Chest discomfort-no recent episodes of arm neck back or chest discomfort.  Notes chest discomfort with previous episodes of atrial fibrillation.  Continues to deny exertional chest discomfort.  Previously noted to have chronic DOE which remained stable.  Coronary CTA 11/18/2020 showed coronary calcium score of 1.68, normal coronary origin with right dominance, and no evidence of obstructive CAD.  Details above.  Disposition: Follow-up with Dr. Claiborne Billings or me in 6-9 months.  Jossie Ng. Yoceline Bazar NP-C    02/08/2021, 5:56 AM [place] [address] Office [phone] Fax [phone]  Notice: This dictation was prepared with Dragon dictation along with smaller phrase technology. Any transcriptional errors that result from this process are unintentional and may not be corrected upon review.  I spent***minutes examining this patient, reviewing medications, and using patient centered shared decision making involving her cardiac care.  Prior to her visit I spent greater than 20 minutes reviewing her past medical history,  medications, and prior cardiac tests.

## 2021-02-10 ENCOUNTER — Ambulatory Visit: Payer: No Typology Code available for payment source | Admitting: General Practice

## 2021-02-10 ENCOUNTER — Ambulatory Visit: Payer: No Typology Code available for payment source | Admitting: Medical

## 2021-03-24 LAB — HM MAMMOGRAPHY

## 2021-03-26 ENCOUNTER — Encounter: Payer: Self-pay | Admitting: Family Medicine

## 2021-04-19 ENCOUNTER — Other Ambulatory Visit: Payer: Self-pay | Admitting: Cardiovascular Disease

## 2021-04-26 ENCOUNTER — Emergency Department (HOSPITAL_BASED_OUTPATIENT_CLINIC_OR_DEPARTMENT_OTHER)
Admission: EM | Admit: 2021-04-26 | Discharge: 2021-04-26 | Disposition: A | Discharge status: Home / Self Care | Payer: No Typology Code available for payment source | Attending: Emergency Medicine | Admitting: Emergency Medicine

## 2021-04-26 ENCOUNTER — Ambulatory Visit
Admission: EM | Admit: 2021-04-26 | Discharge: 2021-04-26 | Disposition: A | Discharge status: Home / Self Care | Payer: No Typology Code available for payment source

## 2021-04-26 ENCOUNTER — Encounter (HOSPITAL_BASED_OUTPATIENT_CLINIC_OR_DEPARTMENT_OTHER): Payer: Self-pay

## 2021-04-26 ENCOUNTER — Emergency Department (HOSPITAL_BASED_OUTPATIENT_CLINIC_OR_DEPARTMENT_OTHER): Payer: No Typology Code available for payment source

## 2021-04-26 ENCOUNTER — Other Ambulatory Visit: Payer: Self-pay

## 2021-04-26 DIAGNOSIS — H8110 Benign paroxysmal vertigo, unspecified ear: Secondary | ICD-10-CM

## 2021-04-26 DIAGNOSIS — H81399 Other peripheral vertigo, unspecified ear: Secondary | ICD-10-CM

## 2021-04-26 DIAGNOSIS — Z79899 Other long term (current) drug therapy: Secondary | ICD-10-CM | POA: Insufficient documentation

## 2021-04-26 DIAGNOSIS — R42 Dizziness and giddiness: Secondary | ICD-10-CM | POA: Diagnosis present

## 2021-04-26 DIAGNOSIS — H539 Unspecified visual disturbance: Secondary | ICD-10-CM

## 2021-04-26 DIAGNOSIS — I4891 Unspecified atrial fibrillation: Secondary | ICD-10-CM | POA: Diagnosis not present

## 2021-04-26 DIAGNOSIS — Z7982 Long term (current) use of aspirin: Secondary | ICD-10-CM | POA: Diagnosis not present

## 2021-04-26 LAB — CBC WITH DIFFERENTIAL/PLATELET
Abs Immature Granulocytes: 0.01 10*3/uL (ref 0.00–0.07)
Basophils Absolute: 0.1 10*3/uL (ref 0.0–0.1)
Basophils Relative: 1 %
Eosinophils Absolute: 0.1 10*3/uL (ref 0.0–0.5)
Eosinophils Relative: 2 %
HCT: 42.1 % (ref 36.0–46.0)
Hemoglobin: 14.2 g/dL (ref 12.0–15.0)
Immature Granulocytes: 0 %
Lymphocytes Relative: 29 %
Lymphs Abs: 2.1 10*3/uL (ref 0.7–4.0)
MCH: 31.9 pg (ref 26.0–34.0)
MCHC: 33.7 g/dL (ref 30.0–36.0)
MCV: 94.6 fL (ref 80.0–100.0)
Monocytes Absolute: 0.5 10*3/uL (ref 0.1–1.0)
Monocytes Relative: 7 %
Neutro Abs: 4.4 10*3/uL (ref 1.7–7.7)
Neutrophils Relative %: 61 %
Platelets: 302 10*3/uL (ref 150–400)
RBC: 4.45 MIL/uL (ref 3.87–5.11)
RDW: 12.9 % (ref 11.5–15.5)
WBC: 7.1 10*3/uL (ref 4.0–10.5)
nRBC: 0 % (ref 0.0–0.2)

## 2021-04-26 LAB — BASIC METABOLIC PANEL
Anion gap: 10 (ref 5–15)
BUN: 15 mg/dL (ref 8–23)
CO2: 26 mmol/L (ref 22–32)
Calcium: 9.7 mg/dL (ref 8.9–10.3)
Chloride: 102 mmol/L (ref 98–111)
Creatinine, Ser: 0.9 mg/dL (ref 0.44–1.00)
GFR, Estimated: >60 mL/min (ref >60)
Glucose, Bld: 99 mg/dL (ref 70–99)
Potassium: 4.2 mmol/L (ref 3.5–5.1)
Sodium: 138 mmol/L (ref 135–145)

## 2021-04-26 LAB — MAGNESIUM: Magnesium: 2.1 mg/dL (ref 1.7–2.4)

## 2021-04-26 MED ORDER — METOCLOPRAMIDE HCL 5 MG/ML IJ SOLN
5.0000 mg | Freq: Once | INTRAMUSCULAR | Status: AC
  Administered 2021-04-26: 5 mg via INTRAVENOUS
  Filled 2021-04-26: qty 2

## 2021-04-26 MED ORDER — MECLIZINE HCL 25 MG PO TABS
25.0000 mg | ORAL_TABLET | Freq: Once | ORAL | Status: AC
  Administered 2021-04-26: 25 mg via ORAL
  Filled 2021-04-26: qty 1

## 2021-04-26 MED ORDER — DIPHENHYDRAMINE HCL 50 MG/ML IJ SOLN
25.0000 mg | Freq: Once | INTRAMUSCULAR | Status: AC
  Administered 2021-04-26: 25 mg via INTRAVENOUS
  Filled 2021-04-26: qty 1

## 2021-04-26 MED ORDER — MECLIZINE HCL 25 MG PO TABS: 25.0000 mg | ORAL_TABLET | Freq: Three times a day (TID) | ORAL | 0 refills | Status: DC | PRN

## 2021-04-26 MED ORDER — SODIUM CHLORIDE 0.9 % IV BOLUS
1000.0000 mL | Freq: Once | INTRAVENOUS | Status: AC
  Administered 2021-04-26: 1000 mL via INTRAVENOUS

## 2021-04-26 MED ORDER — DIAZEPAM 5 MG/ML IJ SOLN
5.0000 mg | Freq: Once | INTRAMUSCULAR | Status: AC
  Administered 2021-04-26: 5 mg via INTRAVENOUS
  Filled 2021-04-26: qty 2

## 2021-04-26 MED ORDER — MAGNESIUM SULFATE 2 GM/50ML IV SOLN
2.0000 g | Freq: Once | INTRAVENOUS | Status: AC
  Administered 2021-04-26: 2 g via INTRAVENOUS
  Filled 2021-04-26: qty 50

## 2021-04-26 MED ORDER — DEXTROSE-NACL 5-0.9 % IV SOLN: INTRAVENOUS | Status: AC

## 2021-04-26 MED ORDER — ONDANSETRON HCL 4 MG/2ML IJ SOLN
4.0000 mg | Freq: Once | INTRAMUSCULAR | Status: AC
  Administered 2021-04-26: 4 mg via INTRAVENOUS
  Filled 2021-04-26: qty 2

## 2021-04-26 NOTE — ED Notes (Signed)
Patient discharged to home.  All discharge instructions reviewed.  Patient verbalized understanding via teachback method.  VS WDL.  Respirations even and unlabored.  Wheelchair out of ED.   ?

## 2021-04-26 NOTE — ED Triage Notes (Signed)
Pt c/o dizziness and blurry vision that began yesterday.

## 2021-04-26 NOTE — ED Notes (Signed)
Pt. Didn't think the EKG was necessary, MD aware.

## 2021-04-26 NOTE — ED Provider Notes (Signed)
[facility]    CSN: [id number] Arrival date & time: 04/26/21  1409      History   Chief Complaint Chief Complaint  Patient presents with   Dizziness    HPI Shirley Carroll is a 66 y.o. female presenting with dizziness and blurred vision for 1 day.  Medical history of vertigo.  States that she has experienced vertigo intermittently for years, but this is the worst it has ever been.  She has not had visual symptoms like this before.  States that her vision is blurry, and is difficult to focus.  States that when she walks, she leans to the side and sometimes has to hold onto the wall to keep her balance.  Denies unilateral weakness.  The dizziness feels like the room is spinning.  She attempted meclizine that she had at home without relief.  States that she attempted Epley maneuvers at home without relief.  Denies headaches, chest pain, shortness of breath.  She is taking daily Xarelto for A-fib as directed.  Denies recent falls or head trauma.  HPI  Past Medical History:  Diagnosis Date   Allergy    Atrial fibrillation ([place])    Colitis    Herniated disc    Herpes zoster 11/07/2011   11/07/2011 right side waistline -T-11 dermatonal distribution >valtrex and steroid taper     History of headache    Osteoarthritis    PONV (postoperative nausea and vomiting)    Renal calculus    RENAL CALCULUS, HX OF 05/01/2007   Qualifier: Diagnosis of  By: Ronnald Ramp CNA/MA, Jessica     Vertigo     Patient Active Problem List   Diagnosis Date Noted   Diastolic dysfunction [id number]   Fatigue 07/07/2019   Vertigo    Well adult exam 01/21/2019   History of migraine headaches 12/10/2014   PAF (paroxysmal atrial fibrillation) ([place]) 08/27/2014   Dysplasia    Osteoarthritis    HEMORRHOIDS, HX OF 07/28/2007   VERTIGO 05/01/2007    Past Surgical History:  Procedure Laterality Date   BACK SURGERY  2004   COLONOSCOPY     GYNECOLOGIC CRYOSURGERY  1982   SHOULDER SURGERY     VAGINAL  HYSTERECTOMY  1991   CIS of the cervix    OB History     Gravida  2   Para  1   Term  1   Preterm      AB  1   Living  1      SAB  1   IAB      Ectopic      Multiple      Live Births               Home Medications    Prior to Admission medications   Medication Sig Start Date End Date Taking? Authorizing Provider  acetaminophen (TYLENOL) 500 MG tablet Take 500 mg by mouth every 6 (six) hours as needed.    [provider]  aspirin-acetaminophen-caffeine (EXCEDRIN MIGRAINE) [phone] MG per tablet Take 2 tablets by mouth every 6 (six) hours as needed for headache.    [provider]  meclizine (ANTIVERT) 12.5 MG tablet Take 1 tablet (12.5 mg total) by mouth 3 (three) times daily as needed for dizziness. 02/02/21   Jaynee Eagles, PA-C  metoprolol succinate (TOPROL-XL) 25 MG 24 hr tablet TAKE 1 TABLET BY MOUTH EVERY DAY 04/20/21   [name], MD  metoprolol tartrate (LOPRESSOR) 50 MG tablet Take 1  tablet (50 mg total) by mouth 2 (two) times daily. To Take Day of CT Scan 1-2 hours prior 11/10/20 02/08/21  [name], PA-C  rivaroxaban (XARELTO) 20 MG TABS tablet Take 1 tablet (20 mg total) by mouth daily with supper. 11/10/20   Kroeger, Lorelee Cover., PA-C    Family History Family History  Problem Relation Age of Onset   Atrial fibrillation Mother    Breast cancer Maternal Aunt        mid 40's   Breast cancer Maternal Aunt 35   Colon cancer Neg Hx    Colon polyps Neg Hx    Esophageal cancer Neg Hx    Rectal cancer Neg Hx    Stomach cancer Neg Hx     Social History Social History   Tobacco Use   Smoking status: Never   Smokeless tobacco: Never  Vaping Use   Vaping Use: Never used  Substance Use Topics   Alcohol use: Yes    Alcohol/week: 3.0 standard drinks    Types: 3 Standard drinks or equivalent per week   Drug use: No     Allergies   Propoxyphene hcl   Review of Systems Review of Systems  Eyes:  Positive for visual  disturbance.  Neurological:  Positive for dizziness.  All other systems reviewed and are negative.   Physical Exam Triage Vital Signs ED Triage Vitals  Enc Vitals Group     BP 04/26/21 1500 123/81     Pulse Rate 04/26/21 1500 66     Resp 04/26/21 1500 18     Temp 04/26/21 1500 97.7 F (36.5 C)     Temp Source 04/26/21 1500 Oral     SpO2 04/26/21 1500 97 %     Weight --      Height --      Head Circumference --      Peak Flow --      Pain Score 04/26/21 1459 0     Pain Loc --      Pain Edu? --      Excl. in [place]? --    No data found.  Updated Vital Signs BP 123/81 (BP Location: Right Arm)    Pulse 66    Temp 97.7 F (36.5 C) (Oral)    Resp 18    SpO2 97%   Visual Acuity Right Eye Distance:   Left Eye Distance:   Bilateral Distance:    Right Eye Near:   Left Eye Near:    Bilateral Near:     Physical Exam Vitals reviewed.  Constitutional:      General: She is not in acute distress.    Appearance: Normal appearance. She is not ill-appearing.  HENT:     Head: Normocephalic and atraumatic.  Eyes:     Extraocular Movements: Extraocular movements intact.     Pupils: Pupils are equal, round, and reactive to light.  Cardiovascular:     Rate and Rhythm: Normal rate and regular rhythm.     Heart sounds: Normal heart sounds.  Pulmonary:     Effort: Pulmonary effort is normal.     Breath sounds: Normal breath sounds. No wheezing, rhonchi or rales.  Musculoskeletal:     Cervical back: Normal range of motion and neck supple. No rigidity.  Lymphadenopathy:     Cervical: No cervical adenopathy.  Skin:    Capillary Refill: Capillary refill takes less than 2 seconds.  Neurological:     General: No focal deficit present.  Mental Status: She is alert and oriented to person, place, and time. Mental status is at baseline.     Cranial Nerves: No cranial nerve deficit or facial asymmetry.     Sensory: Sensation is intact. No sensory deficit.     Motor: Motor function is  intact. No weakness.     Coordination: Coordination is intact. Coordination normal.     Gait: Gait is intact. Gait normal.     Comments: AOx3. CN 2-12 intact. No weakness or numbness in UEs or LEs.  Negative fingers to thumb, Romberg, pronator drift.  Visual acuity grossly intact.  Psychiatric:        Mood and Affect: Mood normal.        Behavior: Behavior normal.        Thought Content: Thought content normal.        Judgment: Judgment normal.     UC Treatments / Results  Labs (all labs ordered are listed, but only abnormal results are displayed) Labs Reviewed - No data to display  EKG   Radiology No results found.  Procedures Procedures (including critical care time)  Medications Ordered in UC Medications - No data to display  Initial Impression / Assessment and Plan / UC Course  I have reviewed the triage vital signs and the nursing notes.  Pertinent labs & imaging results that were available during my care of the patient were reviewed by me and considered in my medical decision making (see chart for details).     This patient is a very pleasant 66 y.o. year old female presenting with vertigo and visual changes.  Reassuring neuro exam.  She has a long history of intermittent BPPV, but never with vision changes before.  Has already attempted Epley maneuvers and meclizine without relief.  There is no associated headache, chest pain, shortness of breath.  She is taking Xarelto as directed for A-fib.  Given new onset of visual symptoms, discussed that I cannot completely rule out intracranial pathology at urgent care, she is amenable to evaluation in the emergency department today.  Declines transport via EMS.   Final Clinical Impressions(s) / UC Diagnoses   Final diagnoses:  Benign paroxysmal positional vertigo, unspecified laterality  Change in vision     Discharge Instructions      -You may just have vertigo, but I can't rule out a change in intracranial pathology  like stroke at urgent care. Please head to the ED.   ED Prescriptions   None    PDMP not reviewed this encounter.   Hazel Sams, PA-C 04/26/21 1552

## 2021-04-26 NOTE — ED Provider Notes (Signed)
[facility] EMERGENCY DEPARTMENT Provider Note   CSN: [id number] Arrival date & time: 04/26/21  1544     History  Chief Complaint  Patient presents with   Dizziness    Shirley Carroll is a 66 y.o. female.  This is a 66 y.o. female  with significant medical history as below, including vertigo, afib, OA who presents to the ED with complaint of dizziness.  Onset 2 days ago.  Initially resolved with meclizine.  Last dose was yesterday evening.  Woke up this morning with ongoing symptoms.  Room spinning sensation, mild blurry vision.  Symptoms worsen with head movement, standing, looking around the room.  Similar to prior episodes of vertigo.  Has not taken any meclizine today.  She was seen urgent care earlier today and sent to the ER for further evaluation.  No numbness, tingling, hearing changes, diplopia, nausea or vomiting.  No fevers or chills.  No illicit drug use.  No alcohol use.  No tobacco use.  No trauma.  No headache.  No neck pain.   Past Medical History: No date: Allergy No date: Atrial fibrillation ([place]) No date: Colitis No date: Herniated disc 11/07/2011: Herpes zoster     Comment:  11/07/2011 right side waistline -T-11 dermatonal               distribution >valtrex and steroid taper   No date: History of headache No date: Osteoarthritis No date: PONV (postoperative nausea and vomiting) No date: Renal calculus 05/01/2007: RENAL CALCULUS, HX OF     Comment:  Qualifier: Diagnosis of  By: Ronnald Ramp CNA/MA, Jessica   No date: Vertigo  Past Surgical History: 2004: BACK SURGERY No date: COLONOSCOPY 1982: GYNECOLOGIC CRYOSURGERY No date: SHOULDER SURGERY 1991: VAGINAL HYSTERECTOMY     Comment:  CIS of the cervix    The history is provided by the patient. No language interpreter was used.  Dizziness Associated symptoms: no chest pain, no headaches, no nausea, no palpitations, no shortness of breath, no tinnitus and no vomiting       Home Medications Prior  to Admission medications   Medication Sig Start Date End Date Taking? Authorizing Provider  acetaminophen (TYLENOL) 500 MG tablet Take 500 mg by mouth every 6 (six) hours as needed.    [provider]  aspirin-acetaminophen-caffeine (EXCEDRIN MIGRAINE) [phone] MG per tablet Take 2 tablets by mouth every 6 (six) hours as needed for headache.    [provider]  meclizine (ANTIVERT) 25 MG tablet Take 1 tablet (25 mg total) by mouth 3 (three) times daily as needed for dizziness. 04/26/21   [name], DO  metoprolol succinate (TOPROL-XL) 25 MG 24 hr tablet TAKE 1 TABLET BY MOUTH EVERY DAY 04/20/21   [name], MD  metoprolol tartrate (LOPRESSOR) 50 MG tablet Take 1 tablet (50 mg total) by mouth 2 (two) times daily. To Take Day of CT Scan 1-2 hours prior 11/10/20 02/08/21  [name], PA-C  rivaroxaban (XARELTO) 20 MG TABS tablet Take 1 tablet (20 mg total) by mouth daily with supper. 11/10/20   Kroeger, Lorelee Cover., PA-C      Allergies    Other and Propoxyphene hcl    Review of Systems   Review of Systems  Constitutional:  Negative for chills and fever.  HENT:  Negative for facial swelling, tinnitus and trouble swallowing.   Eyes:  Negative for photophobia and pain.  Respiratory:  Negative for cough and shortness of breath.   Cardiovascular:  Negative  for chest pain and palpitations.  Gastrointestinal:  Negative for abdominal pain, nausea and vomiting.  Endocrine: Negative for polydipsia and polyuria.  Genitourinary:  Negative for difficulty urinating and hematuria.  Musculoskeletal:  Negative for gait problem and joint swelling.  Skin:  Negative for pallor and rash.  Neurological:  Positive for dizziness. Negative for syncope and headaches.  Psychiatric/Behavioral:  Negative for agitation and confusion.    Physical Exam Updated Vital Signs BP 115/69    Pulse 69    Temp 98.1 F (36.7 C) (Oral)    Resp 18    Ht 5\' 3"  (1.6 m)    Wt 75.8 kg    SpO2 98%    BMI  29.58 kg/m  Physical Exam Vitals and nursing note reviewed.  Constitutional:      General: She is not in acute distress.    Appearance: Normal appearance.  HENT:     Head: Normocephalic and atraumatic.     Right Ear: External ear normal.     Left Ear: External ear normal.     Nose: Nose normal.     Mouth/Throat:     Mouth: Mucous membranes are moist.  Eyes:     General: Vision grossly intact. Gaze aligned appropriately. No visual field deficit or scleral icterus.       Right eye: No discharge.        Left eye: No discharge.     Extraocular Movements: Extraocular movements intact.     Pupils: Pupils are equal, round, and reactive to light.     Comments: Fatigable horizontal nystagmus  Cardiovascular:     Rate and Rhythm: Normal rate and regular rhythm.     Pulses: Normal pulses.     Heart sounds: Normal heart sounds.  Pulmonary:     Effort: Pulmonary effort is normal. No respiratory distress.     Breath sounds: Normal breath sounds.  Abdominal:     General: Abdomen is flat.     Tenderness: There is no abdominal tenderness.  Musculoskeletal:        General: Normal range of motion.     Cervical back: Full passive range of motion without pain and normal range of motion.     Right lower leg: No edema.     Left lower leg: No edema.  Skin:    General: Skin is warm and dry.     Capillary Refill: Capillary refill takes less than 2 seconds.  Neurological:     Mental Status: She is alert and oriented to person, place, and time.     GCS: GCS eye subscore is 4. GCS verbal subscore is 5. GCS motor subscore is 6.     Cranial Nerves: Cranial nerves 2-12 are intact. No dysarthria or facial asymmetry.     Sensory: Sensation is intact.     Motor: Motor function is intact. No tremor or pronator drift.     Coordination: Coordination is intact. Coordination normal. Finger-Nose-Finger Test normal.     Gait: Gait is intact.     Comments: Hints exam consistent with peripheral vertigo   Psychiatric:        Mood and Affect: Mood normal.        Behavior: Behavior normal.    ED Results / Procedures / Treatments   Labs (all labs ordered are listed, but only abnormal results are displayed) Labs Reviewed  CBC WITH DIFFERENTIAL/PLATELET  BASIC METABOLIC PANEL  MAGNESIUM    EKG None  Radiology CT Head Wo Contrast  Result Date:  04/26/2021 CLINICAL DATA:  Dizziness, blurred vision EXAM: CT HEAD WITHOUT CONTRAST TECHNIQUE: Contiguous axial images were obtained from the base of the skull through the vertex without intravenous contrast. RADIATION DOSE REDUCTION: This exam was performed according to the departmental dose-optimization program which includes automated exposure control, adjustment of the mA and/or kV according to patient size and/or use of iterative reconstruction technique. COMPARISON:  None. FINDINGS: Brain: No acute intracranial abnormality. Specifically, no hemorrhage, hydrocephalus, mass lesion, acute infarction, or significant intracranial injury. Vascular: No hyperdense vessel or unexpected calcification. Skull: No acute calvarial abnormality. Sinuses/Orbits: No acute findings Other: None IMPRESSION: No acute intracranial abnormality. Electronically Signed   By: Rolm Baptise M.D.   On: 04/26/2021 21:26    Procedures Procedures    Medications Ordered in ED Medications  dextrose 5 %-0.9 % sodium chloride infusion (0 mLs Intravenous Stopped 04/26/21 2253)  sodium chloride 0.9 % bolus 1,000 mL (0 mLs Intravenous Stopped 04/26/21 2050)  meclizine (ANTIVERT) tablet 25 mg (25 mg Oral Given 04/26/21 1845)  magnesium sulfate IVPB 2 g 50 mL (0 g Intravenous Stopped 04/26/21 2050)  ondansetron (ZOFRAN) injection 4 mg (4 mg Intravenous Given 04/26/21 1848)  metoCLOPramide (REGLAN) injection 5 mg (5 mg Intravenous Given 04/26/21 2010)  diphenhydrAMINE (BENADRYL) injection 25 mg (25 mg Intravenous Given 04/26/21 2013)  diazepam (VALIUM) injection 5 mg (5 mg Intravenous Given  04/26/21 2130)    ED Course/ Medical Decision Making/ A&P Clinical Course as of 04/27/21 0007  Mon Apr 26, 2021  2220 Pt refused to have EKG completed [SG]    Clinical Course User Index [SG] Jeanell Sparrow, DO                           Medical Decision Making Amount and/or Complexity of Data Reviewed Independent Historian: friend External Data Reviewed: labs, radiology and notes. Labs: ordered. Decision-making details documented in ED Course. Radiology: ordered. Decision-making details documented in ED Course.  Risk Prescription drug management.    CC: Vertigo  This patient presents to the Emergency Department for the above complaint. This involves an extensive number of treatment options and is a complaint that carries with it a high risk of complications and morbidity. Vital signs were reviewed. Serious etiologies considered.  Record review:  Previous records obtained and reviewed  Medical and surgical history as noted above.   Work up as above, notable for:  Lab results that were available during my care of the patient were reviewed by me and considered in my medical decision making.   CT head ordered and reviewed, no acute process  Cardiac monitoring reviewed and interpreted personally which shows NSR  Social determinants of health include - N/a  Labs reviewed and are stable  Management: Meclizine, magnesium, IV fluids, Zofran  Reassessment:    Patient presents with vertigo with symptoms consistent with prior episodes of vertigo. On initial evaluation patient appears in no acute distress, afebrile with normal vital signs. Vertigo most suggestive of peripheral cause. Neuro intact without sign of CNS ischemia or other serious etiology. DC on Meclizine with close PCP F/U. Warnings discussed.   Patient in no distress and overall condition improved here in the ED. Detailed discussions were had with the patient regarding current findings, and need for close f/u with  PCP or on call doctor. F/u w/ ENT. The patient has been instructed to return immediately if the symptoms worsen in any way for re-evaluation. Patient verbalized understanding and is in  agreement with current care plan. All questions answered prior to discharge.     This chart was dictated using voice recognition software.  Despite best efforts to proofread,  errors can occur which can change the documentation meaning.         Final Clinical Impression(s) / ED Diagnoses Final diagnoses:  Peripheral vertigo, unspecified laterality    Rx / DC Orders ED Discharge Orders          Ordered    meclizine (ANTIVERT) 25 MG tablet  3 times daily PRN,   Status:  Discontinued        04/26/21 2232    meclizine (ANTIVERT) 25 MG tablet  3 times daily PRN        04/26/21 2248              [name], DO 04/27/21 0007

## 2021-04-26 NOTE — ED Triage Notes (Signed)
Pt c/o dizziness, blurred vision when she woke Sunday am-denies injury-reports hx of vertigo "many times"-states she was taken meclizine w/o relief-NAD-steady gait

## 2021-04-26 NOTE — Discharge Instructions (Addendum)
It was a pleasure caring for you today in the emergency department. ° °Please return to the emergency department for any worsening or worrisome symptoms. ° ° °

## 2021-04-26 NOTE — ED Notes (Signed)
Patient is being discharged from the Urgent Care and sent to the Emergency Department via [place]. Per L.GRAHAM PA-C, patient is in need of higher level of care due to further imaging. Patient is aware and verbalizes understanding of plan of care.  Vitals:   04/26/21 1500  BP: 123/81  Pulse: 66  Resp: 18  Temp: 97.7 F (36.5 C)  SpO2: 97%

## 2021-04-26 NOTE — Discharge Instructions (Addendum)
-  You may just have vertigo, but I can't rule out a change in intracranial pathology like stroke at urgent care. Please head to the ED.

## 2021-04-28 ENCOUNTER — Telehealth: Payer: Self-pay | Admitting: General Practice

## 2021-04-28 NOTE — Telephone Encounter (Signed)
Transition Care Management Unsuccessful Follow-up Telephone Call  Date of discharge and from where:  04/26/21 from [facility]  Attempts:  1st Attempt  Reason for unsuccessful TCM follow-up call:  Left voice message

## 2021-04-29 ENCOUNTER — Other Ambulatory Visit: Payer: Self-pay

## 2021-04-29 ENCOUNTER — Ambulatory Visit: Payer: No Typology Code available for payment source | Admitting: Family Medicine

## 2021-04-29 ENCOUNTER — Encounter: Payer: Self-pay | Admitting: Family Medicine

## 2021-04-29 VITALS — BP 117/76 | HR 67 | Ht 64.0 in | Wt 170.0 lb

## 2021-04-29 DIAGNOSIS — R42 Dizziness and giddiness: Secondary | ICD-10-CM

## 2021-04-29 NOTE — Progress Notes (Signed)
Shirley Carroll - 66 y.o. female MRN [id number]  Date of birth: [birth year]  Subjective Chief Complaint  Patient presents with   Dizziness    HPI Shirley Carroll is a 66 year old female here today for follow-up of recent ER visit.  She has had issues with vertigo for several years.  She had recent episode which lasted longer than usual and is concerned about stroke and she was seen in the ED.  She had negative work-up in the ED and symptoms were consistent with her typical vertigo symptoms other than lasting for a longer period of time.  She has been taking meclizine with some improvement in her symptoms.  Her symptoms have resolved today.  She denies any tinnitus, changes in hearing, headaches or fullness in her ears.  ROS:  A comprehensive ROS was completed and negative except as noted per HPI  Allergies  Allergen Reactions   Other     States she needs phenergan with any narcotic pain meds   Propoxyphene Hcl     REACTION: extreme nausea  [place]    Past Medical History:  Diagnosis Date   Allergy    Atrial fibrillation ([place])    Colitis    Herniated disc    Herpes zoster 11/07/2011   11/07/2011 right side waistline -T-11 dermatonal distribution >valtrex and steroid taper     History of headache    Osteoarthritis    PONV (postoperative nausea and vomiting)    Renal calculus    RENAL CALCULUS, HX OF 05/01/2007   Qualifier: Diagnosis of  By: Ronnald Ramp CNA/MA, Jessica     Vertigo     Past Surgical History:  Procedure Laterality Date   BACK SURGERY  2004   COLONOSCOPY     GYNECOLOGIC CRYOSURGERY  1982   SHOULDER SURGERY     VAGINAL HYSTERECTOMY  1991   CIS of the cervix    Social History   Socioeconomic History   Marital status: Widowed    Spouse name: Photographer   Number of children: Not on file   Years of education: Not on file   Highest education level: Not on file  Occupational History    Employer: [employer]  Tobacco Use   Smoking status: Never   Smokeless tobacco: Never   Vaping Use   Vaping Use: Never used  Substance and Sexual Activity   Alcohol use: Yes    Alcohol/week: 3.0 standard drinks    Types: 3 Standard drinks or equivalent per week    Comment: weekly   Drug use: No   Sexual activity: Yes    Birth control/protection: Surgical, Implant    Comment: 1st intercourse 66 yo-More than 5 partners  Other Topics Concern   Not on file  Social History Narrative   Not on file   Social Determinants of Health   Financial Resource Strain: Not on file  Food Insecurity: Not on file  Transportation Needs: Not on file  Physical Activity: Not on file  Stress: Not on file  Social Connections: Not on file    Family History  Problem Relation Age of Onset   Atrial fibrillation Mother    Breast cancer Maternal Aunt        mid 71's   Breast cancer Maternal Aunt 35   Colon cancer Neg Hx    Colon polyps Neg Hx    Esophageal cancer Neg Hx    Rectal cancer Neg Hx    Stomach cancer Neg Hx     Health Maintenance  Topic Date  Due   Hepatitis C Screening  Never done   Zoster Vaccines- Shingrix (1 of 2) Never done   COLONOSCOPY (Pts 45-75yrs Insurance coverage will need to be confirmed)  08/24/2016   PAP SMEAR-Modifier  08/19/2017   COVID-19 Vaccine (3 - Pfizer risk series) 08/07/2019   Pneumonia Vaccine 62+ Years old (1 - PCV) Never done   INFLUENZA VACCINE  10/12/2020   TETANUS/TDAP  12/12/2021   MAMMOGRAM  03/25/2023   DEXA SCAN  Completed   HPV VACCINES  Aged Out     ----------------------------------------------------------------------------------------------------------------------------------------------------------------------------------------------------------------- Physical Exam BP 117/76 (BP Location: Right Arm, Patient Position: Sitting, Cuff Size: Normal)    Pulse 67    Ht 5\' 4"  (1.626 m)    Wt 170 lb (77.1 kg)    SpO2 98%    BMI 29.18 kg/m   Physical Exam Constitutional:      Appearance: Normal appearance.  HENT:     Right Ear:  Tympanic membrane normal.     Left Ear: Tympanic membrane normal.  Eyes:     General: No scleral icterus. Cardiovascular:     Rate and Rhythm: Normal rate and regular rhythm.  Pulmonary:     Effort: Pulmonary effort is normal.     Breath sounds: Normal breath sounds.  Musculoskeletal:     Cervical back: Neck supple.  Neurological:     General: No focal deficit present.     Mental Status: She is alert.     Cranial Nerves: No cranial nerve deficit.     Motor: No weakness.    ------------------------------------------------------------------------------------------------------------------------------------------------------------------------------------------------------------------- Assessment and Plan  VERTIGO History of recurrent vertigo.  No red flags on exam.  Referral placed to physical therapy for vestibular rehab.  She may continue meclizine as needed.   No orders of the defined types were placed in this encounter.   No follow-ups on file.    This visit occurred during the SARS-CoV-2 public health emergency.  Safety protocols were in place, including screening questions prior to the visit, additional usage of staff PPE, and extensive cleaning of exam room while observing appropriate contact time as indicated for disinfecting solutions.

## 2021-04-29 NOTE — Assessment & Plan Note (Signed)
History of recurrent vertigo.  No red flags on exam.  Referral placed to physical therapy for vestibular rehab.  She may continue meclizine as needed.

## 2021-04-29 NOTE — Patient Instructions (Signed)
Vertigo Vertigo is the feeling that you or your surroundings are moving when they are not. This feeling can come and go at any time. Vertigo often goes away on its own. Vertigo can be dangerous if it occurs while you are doing something thatcould endanger yourself or others, such as driving or operating machinery. Your health care provider will do tests to try to determine the cause of your vertigo. Tests will also help your health care provider decide how best totreat your condition. Follow these instructions at home: Eating and drinking     Dehydration can make vertigo worse. Drink enough fluid to keep your urine pale yellow. Do not drink alcohol. Activity Return to your normal activities as told by your health care provider. Ask your health care provider what activities are safe for you. In the morning, first sit up on the side of the bed. When you feel okay, stand slowly while you hold onto something until you know that your balance is fine. Move slowly. Avoid sudden body or head movements or certain positions, as told by your health care provider. If you have trouble walking or keeping your balance, try using a cane for stability. If you feel dizzy or unstable, sit down right away. Avoid doing any tasks that would cause danger to you or others if vertigo occurs. Avoid bending down if you feel dizzy. Place items in your home so that they are easy for you to reach without bending or leaning over. Do not drive or use machinery if you feel dizzy. General instructions Take over-the-counter and prescription medicines only as told by your health care provider. Keep all follow-up visits. This is important. Contact a health care provider if: Your medicines do not relieve your vertigo or they make it worse. Your condition gets worse or you develop new symptoms. You have a fever. You develop nausea or vomiting, or if nausea gets worse. Your family or friends notice any behavioral changes. You  have numbness or a prickling and tingling sensation in part of your body. Get help right away if you: Are always dizzy or you faint. Develop severe headaches. Develop a stiff neck. Develop sensitivity to light. Have difficulty moving or speaking. Have weakness in your hands, arms, or legs. Have changes in your hearing or vision. These symptoms may represent a serious problem that is an emergency. Do not wait to see if the symptoms will go away. Get medical help right away. Call your local emergency services (911 in the [place] Do not drive yourself to the hospital. Summary Vertigo is the feeling that you or your surroundings are moving when they are not. Your health care provider will do tests to try to determine the cause of your vertigo. Follow instructions for home care. You may be told to avoid certain tasks, positions, or movements. Contact a health care provider if your medicines do not relieve your symptoms, or if you have a fever, nausea, vomiting, or changes in behavior. Get help right away if you have severe headaches or difficulty speaking, or you develop hearing or vision problems. This information is not intended to replace advice given to you by your health care provider. Make sure you discuss any questions you have with your healthcare provider. Document Revised: 01/29/2020 Document Reviewed: 01/29/2020 Elsevier Patient Education  2022 [employer] Inc[employer]  

## 2021-04-30 NOTE — Telephone Encounter (Signed)
Transition Care Management Follow-up Telephone Call Date of discharge and from where: 02/23/22 from [facility] How have you been since you were released from the hospital? Patient had OV with Dr. Ashley Royalty yesterday.  Any questions or concerns? No

## 2021-05-19 ENCOUNTER — Telehealth: Payer: No Typology Code available for payment source | Admitting: Family Medicine

## 2021-05-19 ENCOUNTER — Encounter: Payer: Self-pay | Admitting: Family Medicine

## 2021-05-19 DIAGNOSIS — U071 COVID-19: Secondary | ICD-10-CM | POA: Diagnosis not present

## 2021-05-19 MED ORDER — MOLNUPIRAVIR EUA 200MG CAPSULE: 4.0000 | ORAL_CAPSULE | Freq: Two times a day (BID) | ORAL | 0 refills | Status: AC

## 2021-05-19 NOTE — Assessment & Plan Note (Signed)
Recommend continued symptomatic and supportive care with over-the-counter medications and increase fluid.  Overall she is feeling better at this time however I will do a incision and course of molnupiravir that she can add on if symptoms worsen again over the next couple of days.  Unable to utilize Paxlovid due to interaction with Xarelto.  Red flags reviewed with her with instructions to seek emergency care if symptoms significantly worsen. ?

## 2021-05-19 NOTE — Progress Notes (Signed)
?Shirley Carroll - 66 y.o. female MRN [id number]  Date of birth: 1955-12-04 ? ? ?This visit type was conducted due to national recommendations for restrictions regarding the COVID-19 Pandemic (e.g. social distancing).  This format is felt to be most appropriate for this patient at this time.  All issues noted in this document were discussed and addressed.  No physical exam was performed (except for noted visual exam findings with Video Visits).  I discussed the limitations of evaluation and management by telemedicine and the availability of in person appointments. The patient expressed understanding and agreed to proceed. ? ?I connected withNAME@ on 05/19/21 at  2:10 PM EST by a video enabled telemedicine application and verified that I am speaking with the correct person using two identifiers. ? ?Present at visit: ?Luetta Nutting, DO ?Tamala Fothergill ? ? ?Patient Location: [place], [place] ? ?Provider location:   ?[facility] ? ?Chief Complaint  ?Patient presents with  ? Acute Visit  ?  Tested positive last night  ?Symptoms started Monday   ? Chills  ? Fever  ? Generalized Body Aches  ? Nasal Congestion  ? Cough  ? ? ?HPI ? ?Shirley Carroll is a 66 y.o. female who presents via audio/video conferencing for a telehealth visit today.  Reports recently testing positive for COVID-19.  Symptoms started approximately 3 days ago with positive test last night.  Current symptoms include chills, fever, body aches, fatigue, congestion and cough.  She does feel better today compared to yesterday.  She is using cetirizine to help with congestion.  She does use Tylenol as needed.  She denies any difficulty breathing, wheezing or chest pain. ? ?ROS:  A comprehensive ROS was completed and negative except as noted per HPI ? ? ? ? ? ?Past Medical History:  ?Diagnosis Date  ? Allergy   ? Atrial fibrillation ([place])   ? Colitis   ? Herniated disc   ? Herpes zoster 11/07/2011  ? 11/07/2011 right side waistline -T-11 dermatonal distribution >valtrex and  steroid taper    ? History of headache   ? Osteoarthritis   ? PONV (postoperative nausea and vomiting)   ? Renal calculus   ? RENAL CALCULUS, HX OF 05/01/2007  ? Qualifier: Diagnosis of  By: Ronnald Ramp CNA/MA, Janett Billow    ? Vertigo   ? ? ?Past Surgical History:  ?Procedure Laterality Date  ? BACK SURGERY  2004  ? COLONOSCOPY    ? [place]  ? SHOULDER SURGERY    ? VAGINAL HYSTERECTOMY  1991  ? CIS of the cervix  ? ? ?Family History  ?Problem Relation Age of Onset  ? Atrial fibrillation Mother   ? Breast cancer Maternal Aunt   ?     mid 28's  ? Breast cancer Maternal Aunt 35  ? Colon cancer Neg Hx   ? Colon polyps Neg Hx   ? Esophageal cancer Neg Hx   ? Rectal cancer Neg Hx   ? Stomach cancer Neg Hx   ? ? ?Social History  ? ?Socioeconomic History  ? Marital status: Widowed  ?  Spouse name: walter  ? Number of children: Not on file  ? Years of education: Not on file  ? Highest education level: Not on file  ?Occupational History  ?  Employer: [employer]  ?Tobacco Use  ? Smoking status: Never  ? Smokeless tobacco: Never  ?Vaping Use  ? Vaping Use: Never used  ?Substance and Sexual Activity  ? Alcohol use: Yes  ?  Alcohol/week: 3.0 standard drinks  ?  Types: 3 Standard drinks or equivalent per week  ?  Comment: weekly  ? Drug use: No  ? Sexual activity: Yes  ?  Birth control/protection: Surgical, Implant  ?  Comment: 1st intercourse 66 yo-More than 5 partners  ?Other Topics Concern  ? Not on file  ?Social History Narrative  ? Not on file  ? ?Social Determinants of Health  ? ?Financial Resource Strain: Not on file  ?Food Insecurity: Not on file  ?Transportation Needs: Not on file  ?Physical Activity: Not on file  ?Stress: Not on file  ?Social Connections: Not on file  ?Intimate Partner Violence: Not on file  ? ? ? ?Current Outpatient Medications:  ?  acetaminophen (TYLENOL) 500 MG tablet, Take 500 mg by mouth every 6 (six) hours as needed., Disp: , Rfl:  ?  aspirin-acetaminophen-caffeine (EXCEDRIN  MIGRAINE) 250-250-65 MG per tablet, Take 2 tablets by mouth every 6 (six) hours as needed for headache., Disp: , Rfl:  ?  meclizine (ANTIVERT) 25 MG tablet, Take 1 tablet (25 mg total) by mouth 3 (three) times daily as needed for dizziness., Disp: 15 tablet, Rfl: 0 ?  metoprolol succinate (TOPROL-XL) 25 MG 24 hr tablet, TAKE 1 TABLET BY MOUTH EVERY DAY, Disp: 90 tablet, Rfl: 3 ?  molnupiravir EUA (LAGEVRIO) 200 mg CAPS capsule, Take 4 capsules (800 mg total) by mouth 2 (two) times daily for 5 days., Disp: 40 capsule, Rfl: 0 ?  rivaroxaban (XARELTO) 20 MG TABS tablet, Take 1 tablet (20 mg total) by mouth daily with supper., Disp: 90 tablet, Rfl: 3 ? ?EXAM: ? ?VITALS per patient if applicable: Ht 5\' 4"  (1.626 m)   Wt 170 lb (77.1 kg)   BMI 29.18 kg/m?  ? ?GENERAL: alert, oriented, appears well and in no acute distress ? ?HEENT: atraumatic, conjunttiva clear, no obvious abnormalities on inspection of external nose and ears ? ?NECK: normal movements of the head and neck ? ?LUNGS: on inspection no signs of respiratory distress, breathing rate appears normal, no obvious gross SOB, gasping or wheezing ? ?CV: no obvious cyanosis ? ?MS: moves all visible extremities without noticeable abnormality ? ?PSYCH/NEURO: pleasant and cooperative, no obvious depression or anxiety, speech and thought processing grossly intact ? ?ASSESSMENT AND PLAN: ? ?Discussed the following assessment and plan: ? ?COVID-19 ?Recommend continued symptomatic and supportive care with over-the-counter medications and increase fluid.  Overall she is feeling better at this time however I will do a incision and course of molnupiravir that she can add on if symptoms worsen again over the next couple of days.  Unable to utilize Paxlovid due to interaction with Xarelto.  Red flags reviewed with her with instructions to seek emergency care if symptoms significantly worsen. ? ? ?  ?I discussed the assessment and treatment plan with the patient. The patient was  provided an opportunity to ask questions and all were answered. The patient agreed with the plan and demonstrated an understanding of the instructions. ?  ?The patient was advised to call back or seek an in-person evaluation if the symptoms worsen or if the condition fails to improve as anticipated. ? ? ? ?Luetta Nutting, DO  ? ?

## 2021-05-27 ENCOUNTER — Other Ambulatory Visit: Payer: Self-pay

## 2021-05-27 ENCOUNTER — Encounter: Payer: Self-pay | Admitting: Physical Therapy

## 2021-05-27 ENCOUNTER — Ambulatory Visit: Payer: No Typology Code available for payment source | Attending: Family Medicine | Admitting: Physical Therapy

## 2021-05-27 DIAGNOSIS — R42 Dizziness and giddiness: Secondary | ICD-10-CM | POA: Diagnosis present

## 2021-05-27 NOTE — Patient Instructions (Signed)
Access Code: [email or web address] ?URL: [email or web address] ?Date: 05/27/2021 ?Prepared by: Isabelle Course ? ?Exercises ?Seated Gaze Stabilization with Head Rotation - 3 x daily - 3-5 x weekly - 3 sets - 1 reps - 30-60 seconds hold ?Standing Gaze Stabilization with Head Rotation - 3 x daily - 3-5 x weekly - 3 sets - 1 reps - 30-60 seconds hold ?Walking Gaze Stabilization Head Rotation - 3 x daily - 3-5 x weekly - 3 sets - 1 reps - 30-60 seconds hold ? ?

## 2021-05-27 NOTE — Therapy (Signed)
[place] [facility] ?[place] ?[address], [place], [address] ?Phone: [phone]   Fax:  [phone] ? ?Physical Therapy Evaluation ? ?Patient Details  ?Name: Shirley Carroll ?MRN: [id number] ?Date of Birth: 1955-09-09 ?Referring Provider (PT): Luetta Nutting ? ? ?Encounter Date: 05/27/2021 ? ? PT End of Session - 05/27/21 1225   ? ? Visit Number 1   ? Number of Visits 1   ? Authorization Type aetna   ? PT Start Time 1145   ? PT Stop Time 1220   ? PT Time Calculation (min) 35 min   ? Activity Tolerance Patient tolerated treatment well   ? Behavior During Therapy [facility] for tasks assessed/performed   ? ?  ?  ? ?  ? ? ?Past Medical History:  ?Diagnosis Date  ? Allergy   ? Atrial fibrillation ([place])   ? Colitis   ? Herniated disc   ? Herpes zoster 11/07/2011  ? 11/07/2011 right side waistline -T-11 dermatonal distribution >valtrex and steroid taper    ? History of headache   ? Osteoarthritis   ? PONV (postoperative nausea and vomiting)   ? Renal calculus   ? RENAL CALCULUS, HX OF 05/01/2007  ? Qualifier: Diagnosis of  By: Ronnald Ramp CNA/MA, Janett Billow    ? Vertigo   ? ? ?Past Surgical History:  ?Procedure Laterality Date  ? BACK SURGERY  2004  ? COLONOSCOPY    ? [place]  ? SHOULDER SURGERY    ? VAGINAL HYSTERECTOMY  1991  ? CIS of the cervix  ? ? ?There were no vitals filed for this visit. ? ? ? Subjective Assessment - 05/27/21 1149   ? ? Subjective Pt has a history of vertigo. The most recent flare up was 2/23 and lasted about 5 days. Pt saw MD who recommended physical therapy to reduce length and occurance of bouts of vertigo.   ? Patient Stated Goals reduce length and frequency of vertigo   ? Currently in Pain? No/denies   ? ?  ?  ? ?  ? ? ? ? ? [facility] PT Assessment - 05/27/21 0001   ? ?  ? Assessment  ? Medical Diagnosis vertigo   ? Referring Provider (PT) Luetta Nutting   ? Onset Date/Surgical Date 04/25/21   ? Next MD Visit PRN   ?  ? Precautions  ? Precautions  None   ?  ? Restrictions  ? Weight Bearing Restrictions No   ?  ? Balance Screen  ? Has the patient fallen in the past 6 months No   ?  ? Prior Function  ? Level of Independence Independent   ? Vocation Requirements Scientist, water quality   ?  ? Observation/Other Assessments  ? Focus on Therapeutic Outcomes (FOTO)  not indicated   ? ?  ?  ? ?  ? ? ? ? ? ? ? ? ? Vestibular Assessment - 05/27/21 0001   ? ?  ? Symptom Behavior  ? Subjective history of current problem pt has had multiple occurances of vertigo which usually only last for a day or 2, most recent occurance lasted 5 days   ? Type of Dizziness  Spinning   ? Duration of Dizziness 1-5 days   ? Symptom Nature Spontaneous   ? Aggravating Factors No known aggravating factors   ? Relieving Factors Medication   ?  ? Oculomotor Exam  ? Oculomotor Alignment Normal   ? Ocular ROM normal   ? Spontaneous Absent   ?  Gaze-induced  Absent   ? Head shaking Horizontal Absent   ? Smooth Pursuits Intact   ? Saccades Intact   ?  ? Oculomotor Exam-Fixation Suppressed   ? Left Head Impulse negative   ? Right Head Impulse negative   ?  ? Vestibulo-Ocular Reflex  ? VOR 1 Head Only (x 1 viewing) positive for symptoms with horizontal   ?  ? Positional Testing  ? Dix-Hallpike Dix-Hallpike Right;Dix-Hallpike Left   ? Horizontal Canal Testing Horizontal Canal Right;Horizontal Canal Left   ?  ? Dix-Hallpike Right  ? Dix-Hallpike Right Duration none   ? Dix-Hallpike Right Symptoms No nystagmus   ?  ? Dix-Hallpike Left  ? Dix-Hallpike Left Duration none   ? Dix-Hallpike Left Symptoms No nystagmus   ?  ? Horizontal Canal Right  ? Horizontal Canal Right Duration none   ? Horizontal Canal Right Symptoms Normal   ?  ? Horizontal Canal Left  ? Horizontal Canal Left Duration none   ? Horizontal Canal Left Symptoms Normal   ? ?  ?  ? ?  ? ? ? ? ? ?Objective measurements completed on examination: See above findings.  ? ? ? ? ? ? Vestibular Treatment/Exercise - 05/27/21 0001   ? ?  ? Vestibular  Treatment/Exercise  ? Habituation Exercises Seated Horizontal Head Turns   ?  ? Seated Horizontal Head Turns  ? Number of Reps  10   ? Symptom Description  educated to perform until dizziness is 6/10, then let dizziness resolve before beginning next set   ? ?  ?  ? ?  ? ? ? ? ? ? ? ? ? PT Education - 05/27/21 1209   ? ? Education Details PT POC and goals, HEP   ? Person(s) Educated Patient   ? Methods Explanation;Demonstration;Handout   ? Comprehension Returned demonstration;Verbalized understanding   ? ?  ?  ? ?  ? ? ? ? ? ? PT Long Term Goals - 05/27/21 1228   ? ?  ? PT LONG TERM GOAL #1  ? Title eval only   ? ?  ?  ? ?  ? ? ? ? ? ? ? ? ? Plan - 05/27/21 1225   ? ? Clinical Impression Statement Pt presents with history of vertigo with bouts lasting from 1-5 days. Pt requests exercises that she can perform to prevent further bouts or decrease symptom duration. Pt with no positional sensitivity discovered on eval. Some increase in symptoms with VOR horizontal head turns. Pt issued HEP for current level as well as progression. Pt with good understanding. Pt feels comfortable with HEP. Eval only   ? Personal Factors and Comorbidities Time since onset of injury/illness/exacerbation;Past/Current Experience   ? Stability/Clinical Decision Making Stable/Uncomplicated   ? Clinical Decision Making Low   ? Rehab Potential Good   ? PT Frequency One time visit   ? PT Treatment/Interventions Vestibular   ? PT Next Visit Plan eval only   ? PT Home Exercise Plan [email or web address]   ? Consulted and Agree with Plan of Care Patient   ? ?  ?  ? ?  ? ? ?Patient will benefit from skilled therapeutic intervention in order to improve the following deficits and impairments:  Dizziness ? ?Visit Diagnosis: ?Dizziness and giddiness - Plan: PT plan of care cert/re-cert ? ? ? ? ?Problem List ?Patient Active Problem List  ? Diagnosis Date Noted  ? COVID-19 05/19/2021  ? Bunion 11/25/2020  ? Diastolic dysfunction [id number]  ?  Fatigue 07/07/2019  ?  Vertigo   ? Well adult exam 01/21/2019  ? Pain in right foot 09/07/2017  ? History of migraine headaches 12/10/2014  ? PAF (paroxysmal atrial fibrillation) ([place]) 08/27/2014  ? Dysplasia   ? Osteoarthritis   ? HEMORRHOIDS, HX OF 07/28/2007  ? VERTIGO 05/01/2007  ? ? ?Jaedin Regina, PT ?05/27/2021, 12:30 PM ? ?[place] ?[facility] ?[place] ?[place], [place], [address] ?Phone: [phone]   Fax:  [phone] ? ?Name: Shirley Carroll ?MRN: [id number] ?Date of Birth: Mar 31, 1955 ? ? ?

## 2021-09-16 ENCOUNTER — Encounter: Payer: Self-pay | Admitting: Family Medicine

## 2021-09-16 ENCOUNTER — Other Ambulatory Visit: Payer: Self-pay | Admitting: Family Medicine

## 2021-09-16 ENCOUNTER — Ambulatory Visit: Payer: No Typology Code available for payment source | Admitting: Family Medicine

## 2021-09-16 ENCOUNTER — Ambulatory Visit (INDEPENDENT_AMBULATORY_CARE_PROVIDER_SITE_OTHER): Payer: No Typology Code available for payment source

## 2021-09-16 VITALS — BP 107/68 | HR 73 | Ht 64.0 in | Wt 171.0 lb

## 2021-09-16 DIAGNOSIS — G629 Polyneuropathy, unspecified: Secondary | ICD-10-CM

## 2021-09-16 MED ORDER — GABAPENTIN 300 MG PO CAPS: ORAL_CAPSULE | ORAL | 3 refills | Status: DC

## 2021-09-16 NOTE — Progress Notes (Signed)
Shirley Carroll - 66 y.o. female MRN [id number]  Date of birth: January 06, 1956  Subjective No chief complaint on file.   HPI Shirley Carroll is a 66 year old female here today with complaint of possible neuropathy.  She is having a burning, tingling and throbbing sensation in her lower extremities.  This is mainly across her bilateral shins but does have some radiation to the feet.  She does have osteoarthritis in her knee as well as meniscal tear on her left knee.  She is seeing orthopedics for this.  She has not noted any significant weakness.  She has not tried anything so far for management of this.  ROS:  A comprehensive ROS was completed and negative except as noted per HPI  Allergies  Allergen Reactions   Other     States she needs phenergan with any narcotic pain meds   Propoxyphene Hcl     REACTION: extreme nausea  [place]    Past Medical History:  Diagnosis Date   Allergy    Atrial fibrillation ([facility])    Colitis    Herniated disc    Herpes zoster 11/07/2011   11/07/2011 right side waistline -T-11 dermatonal distribution >valtrex and steroid taper     History of headache    Osteoarthritis    PONV (postoperative nausea and vomiting)    Renal calculus    RENAL CALCULUS, HX OF 05/01/2007   Qualifier: Diagnosis of  By: Yetta Barre CNA/MA, Jessica     Vertigo     Past Surgical History:  Procedure Laterality Date   BACK SURGERY  2004   COLONOSCOPY     GYNECOLOGIC CRYOSURGERY  1982   SHOULDER SURGERY     VAGINAL HYSTERECTOMY  1991   CIS of the cervix    Social History   Socioeconomic History   Marital status: Widowed    Spouse name: Film/video editor   Number of children: Not on file   Years of education: Not on file   Highest education level: Not on file  Occupational History    Employer: [employer]  Tobacco Use   Smoking status: Never   Smokeless tobacco: Never  Vaping Use   Vaping Use: Never used  Substance and Sexual Activity   Alcohol use: Yes    Alcohol/week: 3.0  standard drinks of alcohol    Types: 3 Standard drinks or equivalent per week    Comment: weekly   Drug use: No   Sexual activity: Yes    Birth control/protection: Surgical, Implant    Comment: 1st intercourse 66 yo-More than 5 partners  Other Topics Concern   Not on file  Social History Narrative   Not on file   Social Determinants of Health   Financial Resource Strain: Not on file  Food Insecurity: Not on file  Transportation Needs: Not on file  Physical Activity: Not on file  Stress: Not on file  Social Connections: Not on file    Family History  Problem Relation Age of Onset   Atrial fibrillation Mother    Breast cancer Maternal Aunt        mid 74's   Breast cancer Maternal Aunt 35   Colon cancer Neg Hx    Colon polyps Neg Hx    Esophageal cancer Neg Hx    Rectal cancer Neg Hx    Stomach cancer Neg Hx     Health Maintenance  Topic Date Due   COVID-19 Vaccine (3 - Pfizer risk series) 11/12/2021 (Originally 08/07/2019)   PAP SMEAR-Modifier  12/17/2021 (Originally 08/19/2017)   Zoster Vaccines- Shingrix (1 of 2) 12/17/2021 (Originally 08/29/1973)   Pneumonia Vaccine 70+ Years old (1 - PCV) 06/05/2022 (Originally 08/30/2019)   COLONOSCOPY (Pts 45-69yrs Insurance coverage will need to be confirmed)  06/05/2022 (Originally 08/24/2016)   Hepatitis C Screening  06/05/2022 (Originally 08/29/1972)   INFLUENZA VACCINE  10/12/2021   TETANUS/TDAP  12/12/2021   MAMMOGRAM  03/25/2023   DEXA SCAN  Completed   HPV VACCINES  Aged Out     ----------------------------------------------------------------------------------------------------------------------------------------------------------------------------------------------------------------- Physical Exam BP 107/68 (BP Location: Left Arm, Patient Position: Sitting, Cuff Size: Normal)   Pulse 73   Ht 5\' 4"  (1.626 m)   Wt 171 lb (77.6 kg)   SpO2 97%   BMI 29.35 kg/m   Physical Exam Constitutional:      Appearance: Normal  appearance.  Eyes:     General: No scleral icterus. Cardiovascular:     Rate and Rhythm: Normal rate and regular rhythm.  Pulmonary:     Effort: Pulmonary effort is normal.     Breath sounds: Normal breath sounds.  Musculoskeletal:     Cervical back: Neck supple.  Neurological:     Mental Status: She is alert.     Comments: Normal sensation and strength in lower extremities.  Slightly diminished patellar reflex bilaterally.  Psychiatric:        Mood and Affect: Mood normal.        Behavior: Behavior normal.     ------------------------------------------------------------------------------------------------------------------------------------------------------------------------------------------------------------------- Assessment and Plan  Polyneuropathy She is having symptoms consistent with neuropathy.  Checking CMP, CBC, TSH, B12 and A1c today.  X-ray of the lumbar spine ordered as well.  She can try alpha lipoic acid.  Adding gabapentin as well and can titrate as tolerated.   Meds ordered this encounter  Medications   gabapentin (NEURONTIN) 300 MG capsule    Sig: Take qhs x1 week then may increase to tid as tolerated.    Dispense:  90 capsule    Refill:  3    No follow-ups on file.    This visit occurred during the SARS-CoV-2 public health emergency.  Safety protocols were in place, including screening questions prior to the visit, additional usage of staff PPE, and extensive cleaning of exam room while observing appropriate contact time as indicated for disinfecting solutions.

## 2021-09-16 NOTE — Assessment & Plan Note (Addendum)
She is having symptoms consistent with neuropathy.  Checking CMP, CBC, TSH, B12 and A1c today.  X-ray of the lumbar spine ordered as well.  She can try alpha lipoic acid.  Adding gabapentin as well and can titrate as tolerated.

## 2021-09-17 LAB — CBC WITH DIFFERENTIAL/PLATELET
Absolute Monocytes: 566 cells/uL (ref 200–950)
Basophils Absolute: 67 cells/uL (ref 0–200)
Basophils Relative: 1.2 %
Eosinophils Absolute: 101 cells/uL (ref 15–500)
Eosinophils Relative: 1.8 %
HCT: 38.6 % (ref 35.0–45.0)
Hemoglobin: 13.2 g/dL (ref 11.7–15.5)
Lymphs Abs: 1814 cells/uL (ref 850–3900)
MCH: 32.4 pg (ref 27.0–33.0)
MCHC: 34.2 g/dL (ref 32.0–36.0)
MCV: 94.6 fL (ref 80.0–100.0)
MPV: 9.1 fL (ref 7.5–12.5)
Monocytes Relative: 10.1 %
Neutro Abs: 3052 cells/uL (ref 1500–7800)
Neutrophils Relative %: 54.5 %
Platelets: 281 10*3/uL (ref 140–400)
RBC: 4.08 10*6/uL (ref 3.80–5.10)
RDW: 12.4 % (ref 11.0–15.0)
Total Lymphocyte: 32.4 %
WBC: 5.6 10*3/uL (ref 3.8–10.8)

## 2021-09-17 LAB — BASIC METABOLIC PANEL WITH GFR
BUN: 21 mg/dL (ref 7–25)
CO2: 29 mmol/L (ref 20–32)
Calcium: 10.5 mg/dL — ABNORMAL HIGH (ref 8.6–10.4)
Chloride: 102 mmol/L (ref 98–110)
Creat: 0.85 mg/dL (ref 0.50–1.05)
Glucose, Bld: 105 mg/dL (ref 65–139)
Potassium: 4.5 mmol/L (ref 3.5–5.3)
Sodium: 139 mmol/L (ref 135–146)
eGFR: 75 mL/min/{1.73_m2} (ref >=60)

## 2021-09-17 LAB — VITAMIN B12: Vitamin B-12: 302 pg/mL (ref 200–1100)

## 2021-09-17 LAB — TSH+FREE T4: TSH W/REFLEX TO FT4: 1.6 mIU/L (ref 0.40–4.50)

## 2021-09-17 LAB — HEMOGLOBIN A1C W/OUT EAG: Hgb A1c MFr Bld: 5.5 % of total Hgb (ref <5.7)

## 2021-10-25 ENCOUNTER — Ambulatory Visit: Payer: No Typology Code available for payment source | Admitting: Family Medicine

## 2021-10-25 ENCOUNTER — Encounter: Payer: Self-pay | Admitting: Family Medicine

## 2021-10-25 DIAGNOSIS — I480 Paroxysmal atrial fibrillation: Secondary | ICD-10-CM | POA: Diagnosis not present

## 2021-10-25 DIAGNOSIS — M1711 Unilateral primary osteoarthritis, right knee: Secondary | ICD-10-CM | POA: Diagnosis not present

## 2021-10-25 MED ORDER — CELECOXIB 100 MG PO CAPS: 100.0000 mg | ORAL_CAPSULE | Freq: Two times a day (BID) | ORAL | 0 refills | Status: DC

## 2021-10-25 NOTE — Assessment & Plan Note (Signed)
Osteoarthritis of the right knee.  She has been seen orthopedics previously.  She is on blood thinner we discussed that we can try Celebrex at low strength.  Discussed monitoring for signs of GI bleeding.  She will schedule a follow-up with orthopedics as well.

## 2021-10-25 NOTE — Assessment & Plan Note (Signed)
Management per cardiology.  Stable with Toprol at current strength.  She will continue wheelchair for anticoagulation.

## 2021-10-25 NOTE — Patient Instructions (Signed)
Try celebrex for knee pain.  If you note any bleeding GI bleeding or dark stools let us know or see emergency care.

## 2021-10-25 NOTE — Progress Notes (Signed)
Shirley Carroll - 66 y.o. female MRN [id number]  Date of birth: October 01, 1955  Subjective Chief Complaint  Patient presents with   Knee Pain    HPI Shirley Carroll is a 66 year old female here today for follow-up visit.  She is having increased right knee pain.  Seen orthopedics previously and had steroid injection.  X-rays completed showing severe osteoarthritis.  She had about 3 to 4 days relief from the injection.  Has tried topical Voltaren gel without much improvement.  She has not scheduled a follow-up visit with orthopedics.  She remains on Xarelto and metoprolol for management of paroxysmal atrial fibrillation.  Stable at this time.  ROS:  A comprehensive ROS was completed and negative except as noted per HPI  Allergies  Allergen Reactions   Other     States she needs phenergan with any narcotic pain meds   Propoxyphene Hcl     REACTION: extreme nausea  Darvon    Past Medical History:  Diagnosis Date   Allergy    Atrial fibrillation (HCC)    Colitis    Herniated disc    Herpes zoster 11/07/2011   11/07/2011 right side waistline -T-11 dermatonal distribution >valtrex and steroid taper     History of headache    Osteoarthritis    PONV (postoperative nausea and vomiting)    Renal calculus    RENAL CALCULUS, HX OF 05/01/2007   Qualifier: Diagnosis of  By: Yetta Barre CNA/MA, Jessica     Vertigo     Past Surgical History:  Procedure Laterality Date   BACK SURGERY  2004   COLONOSCOPY     GYNECOLOGIC CRYOSURGERY  1982   SHOULDER SURGERY     VAGINAL HYSTERECTOMY  1991   CIS of the cervix    Social History   Socioeconomic History   Marital status: Widowed    Spouse name: Film/video editor   Number of children: Not on file   Years of education: Not on file   Highest education level: Not on file  Occupational History    Employer: [employer]  Tobacco Use   Smoking status: Never   Smokeless tobacco: Never  Vaping Use   Vaping Use: Never used  Substance and Sexual Activity    Alcohol use: Yes    Alcohol/week: 3.0 standard drinks of alcohol    Types: 3 Standard drinks or equivalent per week    Comment: weekly   Drug use: No   Sexual activity: Yes    Birth control/protection: Surgical, Implant    Comment: 1st intercourse 66 yo-More than 5 partners  Other Topics Concern   Not on file  Social History Narrative   Not on file   Social Determinants of Health   Financial Resource Strain: Not on file  Food Insecurity: Not on file  Transportation Needs: Not on file  Physical Activity: Not on file  Stress: Not on file  Social Connections: Not on file    Family History  Problem Relation Age of Onset   Atrial fibrillation Mother    Breast cancer Maternal Aunt        mid 57's   Breast cancer Maternal Aunt 35   Colon cancer Neg Hx    Colon polyps Neg Hx    Esophageal cancer Neg Hx    Rectal cancer Neg Hx    Stomach cancer Neg Hx     Health Maintenance  Topic Date Due   COVID-19 Vaccine (3 - Pfizer risk series) 11/12/2021 (Originally 08/07/2019)   PAP SMEAR-Modifier  12/17/2021 (Originally 08/19/2017)   Zoster Vaccines- Shingrix (1 of 2) 12/17/2021 (Originally 08/29/1973)   Pneumonia Vaccine 3+ Years old (1 - PCV) 06/05/2022 (Originally 08/30/2019)   COLONOSCOPY (Pts 45-6yrs Insurance coverage will need to be confirmed)  06/05/2022 (Originally 08/24/2016)   Hepatitis C Screening  06/05/2022 (Originally 08/29/1972)   INFLUENZA VACCINE  06/12/2022 (Originally 10/12/2021)   TETANUS/TDAP  12/12/2021   MAMMOGRAM  03/25/2023   DEXA SCAN  Completed   HPV VACCINES  Aged Out     ----------------------------------------------------------------------------------------------------------------------------------------------------------------------------------------------------------------- Physical Exam BP 132/80 (BP Location: Left Arm, Patient Position: Sitting, Cuff Size: Normal)   Pulse 75   Ht 5\' 4"  (1.626 m)   Wt 173 lb (78.5 kg)   SpO2 97%   BMI 29.70 kg/m    Physical Exam Constitutional:      Appearance: Normal appearance.  Eyes:     General: No scleral icterus. Cardiovascular:     Rate and Rhythm: Normal rate and regular rhythm.  Pulmonary:     Effort: Pulmonary effort is normal.     Breath sounds: Normal breath sounds.  Musculoskeletal:     Cervical back: Neck supple.  Neurological:     Mental Status: She is alert.  Psychiatric:        Mood and Affect: Mood normal.        Behavior: Behavior normal.     ------------------------------------------------------------------------------------------------------------------------------------------------------------------------------------------------------------------- Assessment and Plan  PAF (paroxysmal atrial fibrillation) Management per cardiology.  Stable with Toprol at current strength.  She will continue wheelchair for anticoagulation.  Osteoarthritis Osteoarthritis of the right knee.  She has been seen orthopedics previously.  She is on blood thinner we discussed that we can try Celebrex at low strength.  Discussed monitoring for signs of GI bleeding.  She will schedule a follow-up with orthopedics as well.   Meds ordered this encounter  Medications   celecoxib (CELEBREX) 100 MG capsule    Sig: Take 1 capsule (100 mg total) by mouth 2 (two) times daily.    Dispense:  60 capsule    Refill:  0    No follow-ups on file.    This visit occurred during the SARS-CoV-2 public health emergency.  Safety protocols were in place, including screening questions prior to the visit, additional usage of staff PPE, and extensive cleaning of exam room while observing appropriate contact time as indicated for disinfecting solutions.

## 2021-10-26 ENCOUNTER — Telehealth: Payer: Self-pay | Admitting: Family Medicine

## 2021-10-26 NOTE — Telephone Encounter (Signed)
Pt called.  In the future she would like all her scripts sent to [facility].

## 2021-11-16 NOTE — Progress Notes (Unsigned)
Cardiology Clinic Note   Patient Name: Shirley Carroll Date of Encounter: 11/17/2021  Primary Care Provider:  Everrett Coombe, DO Primary Cardiologist:  Nicki Guadalajara, MD  Patient Profile    Shirley Carroll 66 year old female presents to clinic today for follow-up evaluation of her paroxysmal atrial fibrillation.  Past Medical History    Past Medical History:  Diagnosis Date   Allergy    Atrial fibrillation (HCC)    Colitis    Herniated disc    Herpes zoster 11/07/2011   11/07/2011 right side waistline -T-11 dermatonal distribution >valtrex and steroid taper     History of headache    Osteoarthritis    PONV (postoperative nausea and vomiting)    Renal calculus    RENAL CALCULUS, HX OF 05/01/2007   Qualifier: Diagnosis of  By: Yetta Barre CNA/MA, Jessica     Vertigo    Past Surgical History:  Procedure Laterality Date   BACK SURGERY  2004   COLONOSCOPY     GYNECOLOGIC CRYOSURGERY  1982   SHOULDER SURGERY     VAGINAL HYSTERECTOMY  1991   CIS of the cervix    Allergies  Allergies  Allergen Reactions   Other     States she needs phenergan with any narcotic pain meds   Propoxyphene Hcl     REACTION: extreme nausea  Darvon    History of Present Illness    Shirley Carroll has a PMH of paroxysmal atrial fibrillation, polyneuropathy, osteoarthritis, vertigo, hemorrhoids, diastolic dysfunction, fatigue, and right foot pain.  She was seen by Corine Shelter PA-C 2/22.  At that time she reported palpitations.  Palpitations were present once about a month prior to her visit.  She reported associated chest heaviness and shortness of breath.  She took an extra metoprolol 12.5 and symptoms resolved within an hour.  She denied exertional chest discomfort.  There was concern for recurrent atrial fibrillation.  CHA2DS2-VASc score 2.  Follow-up with Dr. Tresa Endo was planned for 6 months.  She was transitioned to Xarelto which was covered by her insurance.  Her echocardiogram in 2016 showed an EF of  55-60%, G2 DD, no regional wall motion abnormalities and no significant valvular abnormalities.  She underwent stress testing via ETT in 2017 which showed no ischemic changes.  She contacted the nurse triage line on 11/09/2020 and reported a 4-hour episode of atrial fibrillation on 09/07/2020.  She indicated that she had not started her Xarelto as previously recommended due to some confusion about the medication.  Follow-up clinic visit was scheduled.  She was seen by Judy Pimple, PA-C on 11/10/2020.  She reported sensation of pressure palpitations with the recent episode.  She also noted some radiation of pain to her left arm with the episode.  Her heart rates were in the 110-120 range she took an extra half tablet of metoprolol x2 which were about 2 hours apart.  She went to sleep about 4 hours later.  She slept for about 6 hours and when she woke up she was in normal rhythm.  She did report fatigue throughout the day.  However, she felt back to her normal self during her follow-up visit.  She denied exertional chest discomfort.  She did note occasional DOE which was stable.  She denied shortness of breath, orthopnea, PND, lower extremity swelling, and lightheadedness/syncope.  She did note that her mother had passed away a month prior following a CVA.  She was agreeable to starting Xarelto.  Compliance was stressed.  She presents to the clinic today for follow-up evaluation states she had 2 episodes of a squeezing type sensation that her accompanied by dizziness and lightheadedness.  She felt as if she may be going into atrial fibrillation.  Her first episode occurred on Saturday and was present for around 4 hours.  Dissipated on its own.  She reports that she was fine on Sunday and then on Monday she was again at the pool in the hot sun and noted her heart rate increased to the 130s.  She took an extra half dose of metoprolol and the episode lasted around 4-5 hours.  She reports compliance with her Xarelto  and denies bleeding issues.  We reviewed the importance of avoiding triggers for atrial fibrillation.  We also reviewed/discussed having her wear a cardiac event monitor if her episodes persist/increase.  Given that she has only had 2 episodes in the last several months we will defer at this time.  I encouraged her to increase her p.o. hydration have her follow-up in 3 to 4 months.  We will order a BMP today.  Today she denies chest pain, shortness of breath, lower extremity edema, fatigue, palpitations, melena, hematuria, hemoptysis, diaphoresis, weakness, presyncope, syncope, orthopnea, and PND.    Home Medications    Prior to Admission medications   Medication Sig Start Date End Date Taking? Authorizing Provider  acetaminophen (TYLENOL) 500 MG tablet Take 500 mg by mouth every 6 (six) hours as needed.    [provider]  aspirin-acetaminophen-caffeine (EXCEDRIN MIGRAINE) [phone] MG per tablet Take 2 tablets by mouth every 6 (six) hours as needed for headache.    [provider]  celecoxib (CELEBREX) 100 MG capsule Take 1 capsule (100 mg total) by mouth 2 (two) times daily. 10/25/21   Luetta Nutting, DO  gabapentin (NEURONTIN) 300 MG capsule Take qhs x1 week then may increase to tid as tolerated. Patient not taking: Reported on 10/25/2021 09/16/21   Luetta Nutting, DO  meclizine (ANTIVERT) 25 MG tablet Take 1 tablet (25 mg total) by mouth 3 (three) times daily as needed for dizziness. 04/26/21   [name], DO  metoprolol succinate (TOPROL-XL) 25 MG 24 hr tablet TAKE 1 TABLET BY MOUTH EVERY DAY 04/20/21   [name], MD  rivaroxaban (XARELTO) 20 MG TABS tablet Take 1 tablet (20 mg total) by mouth daily with supper. 11/10/20   Kroeger, Lorelee Cover., PA-C    Family History    Family History  Problem Relation Age of Onset   Atrial fibrillation Mother    Breast cancer Maternal Aunt        mid 28's   Breast cancer Maternal Aunt 35   Colon cancer Neg Hx    Colon polyps Neg  Hx    Esophageal cancer Neg Hx    Rectal cancer Neg Hx    Stomach cancer Neg Hx    She indicated that her mother is alive. She indicated that her father is alive. She indicated that the status of her neg hx is unknown.  Social History    Social History   Socioeconomic History   Marital status: Widowed    Spouse name: Photographer   Number of children: Not on file   Years of education: Not on file   Highest education level: Not on file  Occupational History    Employer: [employer]  Tobacco Use   Smoking status: Never   Smokeless tobacco: Never  Vaping Use   Vaping Use: Never used  Substance and Sexual Activity   Alcohol use: Yes    Alcohol/week: 3.0 standard drinks of alcohol    Types: 3 Standard drinks or equivalent per week    Comment: weekly   Drug use: No   Sexual activity: Yes    Birth control/protection: Surgical, Implant    Comment: 1st intercourse 66 yo-More than 5 partners  Other Topics Concern   Not on file  Social History Narrative   Not on file   Social Determinants of Health   Financial Resource Strain: Not on file  Food Insecurity: Not on file  Transportation Needs: Not on file  Physical Activity: Not on file  Stress: Not on file  Social Connections: Not on file  Intimate Partner Violence: Not on file     Review of Systems    General:  No chills, fever, night sweats or weight changes.  Cardiovascular:  No chest pain, dyspnea on exertion, edema, orthopnea, palpitations, paroxysmal nocturnal dyspnea. Dermatological: No rash, lesions/masses Respiratory: No cough, dyspnea Urologic: No hematuria, dysuria Abdominal:   No nausea, vomiting, diarrhea, bright red blood per rectum, melena, or hematemesis Neurologic:  No visual changes, wkns, changes in mental status. All other systems reviewed and are otherwise negative except as noted above.  Physical Exam    VS:  BP 110/70   Pulse 71   Ht 5\' 4"  (1.626 m)   Wt 170 lb (77.1 kg)   SpO2 98%   BMI  29.18 kg/m  , BMI Body mass index is 29.18 kg/m. GEN: Well nourished, well developed, in no acute distress. HEENT: normal. Neck: Supple, no JVD, carotid bruits, or masses. Cardiac: RRR, no murmurs, rubs, or gallops. No clubbing, cyanosis, edema.  Radials/DP/PT 2+ and equal bilaterally.  Respiratory:  Respirations regular and unlabored, clear to auscultation bilaterally. GI: Soft, nontender, nondistended, BS + x 4. MS: no deformity or atrophy. Skin: warm and dry, no rash. Neuro:  Strength and sensation are intact. Psych: Normal affect.  Accessory Clinical Findings    Recent Labs: 04/26/2021: Magnesium 2.1 09/16/2021: BUN 21; Creat 0.85; Hemoglobin 13.2; Platelets 281; Potassium 4.5; Sodium 139   Recent Lipid Panel    Component Value Date/Time   CHOL 203 (H) 01/24/2019 1011   TRIG 150.0 (H) 01/24/2019 1011   HDL 49.30 01/24/2019 1011   CHOLHDL 4 01/24/2019 1011   VLDL 30.0 01/24/2019 1011   LDLCALC 124 (H) 01/24/2019 1011         ECG personally reviewed by me today-normal sinus rhythm no ST or T wave deviation 71 bpm- No acute changes  Coronary CTA 11/18/20  FINDINGS: A 120 kV prospective scan was triggered in the descending thoracic aorta at 111 HU's. Axial non-contrast 3 mm slices were carried out through the heart. The data set was analyzed on a dedicated work station and scored using the Agatson method. Gantry rotation speed was 250 msecs and collimation was .6 mm. No beta blockade and 0.8 mg of sl NTG was given. The 3D data set was reconstructed in 5% intervals of the 67-82 % of the R-R cycle. Diastolic phases were analyzed on a dedicated work station using MPR, MIP and VRT modes. The patient received 80 cc of contrast.   Aorta: Normal size. Mild calcification of the descending aorta. No dissection.   Aortic Valve:  Trileaflet.  No calcifications.   Coronary Arteries:  Normal coronary origin.  Right dominance.   RCA is a large dominant artery that gives rise to  PDA and PLVB. There is no  plaque.   Left main is a large artery that gives rise to LAD and LCX arteries. Minimal (<25%) calcified plaque.   LAD is a large vessel that has no plaque.   LCX is a non-dominant artery that gives rise to one large OM1 branch. There is no plaque.   Coronary Calcium Score:   Left main: 1.68   Left anterior descending artery: 0   Left circumflex artery: 0   Right coronary artery: 0   Total: 1.68   Percentile: 51st   Other findings:   Normal pulmonary vein drainage into the left atrium.   Normal let atrial appendage without a thrombus.   Normal size of the pulmonary artery.   Slab reconstruction artifact.   IMPRESSION: 1. Coronary calcium score of 1.68. This was 51st percentile for age-, race-, and sex-matched controls.   2. Normal coronary origin with right dominance.   3. No evidence of obstructive CAD. Minimal (<25%) calcified plaque in the LM. CAD RADS 1.   Skeet Latch, MD  Assessment & Plan   1.  Paroxysmal atrial fibrillation-EKG today shows normal sinus rhythm 71 bpm.  Reports 2 episodes of a squeezing type sensation that was accompanied by dizziness and lightheadedness.  Each episode lasted around 4 hours.  First episode dissipated on its own and second episode she took an extra half dose of her metoprolol.  She was outside in the hot sun her pool during both episodes. Continue metoprolol, Xarelto Heart healthy low-sodium diet Increase physical activity as tolerated Avoid triggers caffeine, chocolate, EtOH, dehydration etc. Order BMP  Chest discomfort-no chest pain today.  Coronary CTA on 722 showed a coronary calcium score of 1.68 which placed her in the 51st percentile for age, race, and sex matched controls.  She was noted to have minimal calcified less than 25% plaque in her left main. Continue to monitor Heart healthy low-sodium diet-salty 6 given Increase physical activity as tolerated  Disposition: Follow-up with  Dr. Claiborne Billings in 3-4 months.   Jossie Ng. Tura Roller NP-C     11/17/2021, 2:21 PM [facility] [place] [address] Office [phone] Fax [phone]  Notice: This dictation was prepared with Dragon dictation along with smaller phrase technology. Any transcriptional errors that result from this process are unintentional and may not be corrected upon review.  I spent 14 minutes examining this patient, reviewing medications, and using patient centered shared decision making involving her cardiac care.  Prior to her visit I spent greater than 20 minutes reviewing her past medical history,  medications, and prior cardiac tests.

## 2021-11-17 ENCOUNTER — Ambulatory Visit: Payer: No Typology Code available for payment source | Attending: General Practice | Admitting: General Practice

## 2021-11-17 ENCOUNTER — Encounter: Payer: Self-pay | Admitting: General Practice

## 2021-11-17 VITALS — BP 110/70 | HR 71 | Ht 64.0 in | Wt 170.0 lb

## 2021-11-17 DIAGNOSIS — I480 Paroxysmal atrial fibrillation: Secondary | ICD-10-CM | POA: Diagnosis not present

## 2021-11-17 DIAGNOSIS — R0789 Other chest pain: Secondary | ICD-10-CM

## 2021-11-17 NOTE — Patient Instructions (Signed)
Medication Instructions:  The current medical regimen is effective;  continue present plan and medications as directed. Please refer to the Current Medication list given to you today.   *If you need a refill on your cardiac medications before your next appointment, please call your pharmacy*  Lab Work:   Testing/Procedures:  BMET TODAY   NONE If you have labs (blood work) drawn today and your tests are completely normal, you will receive your results only by:  1-MyChart Message (if you have MyChart) OR  2-A paper copy in the mail.  If you have any lab test that is abnormal or we need to change your treatment, we will call you to review the results.  Special Instructions Please try to avoid these triggers: Do not use any products that have nicotine or tobacco in them. These include cigarettes, e-cigarettes, and chewing tobacco. If you need help quitting, ask your doctor. Eat heart-healthy foods. Talk with your doctor about the right eating plan for you. Exercise regularly as told by your doctor. Stay hydrated Do not drink alcohol, Caffeine or chocolate. Lose weight if you are overweight. Do not use drugs, including cannabis   MAINTAIN HYDRATION Follow-Up: Your next appointment:  3-4 month(s) In Person with Nicki Guadalajara, MD  or Edd Fabian, FNP      Please call our office 2 months in advance to schedule this appointment   At [facility], you and your health needs are our priority.  As part of our continuing mission to provide you with exceptional heart care, we have created designated Provider Care Teams.  These Care Teams include your primary Cardiologist (physician) and Advanced Practice Providers (APPs -  Physician Assistants and Nurse Practitioners) who all work together to provide you with the care you need, when you need it.  Important Information About Sugar

## 2021-11-18 LAB — BASIC METABOLIC PANEL
BUN/Creatinine Ratio: 21 (ref 12–28)
BUN: 18 mg/dL (ref 8–27)
CO2: 23 mmol/L (ref 20–29)
Calcium: 9.9 mg/dL (ref 8.7–10.3)
Chloride: 106 mmol/L (ref 96–106)
Creatinine, Ser: 0.84 mg/dL (ref 0.57–1.00)
Glucose: 92 mg/dL (ref 70–99)
Potassium: 4.8 mmol/L (ref 3.5–5.2)
Sodium: 143 mmol/L (ref 134–144)
eGFR: 76 mL/min/{1.73_m2} (ref >59)

## 2021-11-23 ENCOUNTER — Other Ambulatory Visit: Payer: Self-pay

## 2021-11-23 MED ORDER — CELECOXIB 100 MG PO CAPS: 100.0000 mg | ORAL_CAPSULE | Freq: Two times a day (BID) | ORAL | 0 refills | Status: DC

## 2021-12-23 ENCOUNTER — Other Ambulatory Visit: Payer: Self-pay | Admitting: Family Medicine

## 2022-02-04 ENCOUNTER — Other Ambulatory Visit: Payer: Self-pay | Admitting: Medical

## 2022-02-07 NOTE — Telephone Encounter (Signed)
Prescription refill request for Xarelto received.  Indication: PAF Last office visit: 11/17/21  Sherlean Foot NP Weight: 77.1kg Age: 66 Scr: 0.84 on 11/17/21 CrCl: 79.10  Based on above findings Xarelto 20mg  daily is the appropriate dose.  Refill approved.

## 2022-04-05 ENCOUNTER — Encounter: Payer: Self-pay | Admitting: Cardiovascular Disease

## 2022-04-05 ENCOUNTER — Ambulatory Visit
Payer: No Typology Code available for payment source | Attending: Cardiovascular Disease | Admitting: Cardiovascular Disease

## 2022-04-05 VITALS — BP 126/82 | HR 72 | Ht 64.0 in | Wt 171.6 lb

## 2022-04-05 DIAGNOSIS — Z7901 Long term (current) use of anticoagulants: Secondary | ICD-10-CM | POA: Diagnosis not present

## 2022-04-05 DIAGNOSIS — I5189 Other ill-defined heart diseases: Secondary | ICD-10-CM | POA: Diagnosis not present

## 2022-04-05 DIAGNOSIS — I480 Paroxysmal atrial fibrillation: Secondary | ICD-10-CM

## 2022-04-05 DIAGNOSIS — R931 Abnormal findings on diagnostic imaging of heart and coronary circulation: Secondary | ICD-10-CM

## 2022-04-05 MED ORDER — RIVAROXABAN 20 MG PO TABS: 20.0000 mg | ORAL_TABLET | Freq: Every day | ORAL | 3 refills | Status: DC

## 2022-04-05 MED ORDER — METOPROLOL SUCCINATE ER 25 MG PO TB24: 25.0000 mg | ORAL_TABLET | Freq: Every day | ORAL | 3 refills | Status: DC

## 2022-04-05 NOTE — Progress Notes (Signed)
Patient ID: SMRITI BARKOW, female   DOB: 1955-08-31, 67 y.o.   MRN: [id number]      HPI: JAZMENE RACZ is a 67 y.o. female who presents for a 34 month follow-up evaluation.  Ms. Olazabal presented to the emergency room on 08/10/2014 after a 2 day history of intermittent shortness of breath and chest pressure.  She was found to have atrial fibrillation on ECG  with a controlled ventricular rate at 98 bpm.  Laboratory was unremarkable including TSH, CBC, BMP, troponin and d-dimer.  She is negative.  Chest x-ray.  She was started on low-dose metoprolo  tartrate 12.5 mg twice a day with follow-up recommended.  She has a cha2ds2vasc score of 1 and anticoagulation therapy was not instituted.  She was seen by extenders in June 2016 and requested that I see her in follow-up for cardiology care since I take care of her husband and she presents for evaluation.  She underwent an echo Doppler study on 09/02/2014.  This revealed normal systolic function with grade 2 diastolic dysfunction.  Trivial mitral regurgitation.  There was very minimal increased.  PA pressure 32 mm.  When I saw her in 2016, she denied any he any recurrent episodes of palpitations.  She was walking daily and denied exertional symptomatology.  In January 2017 she had a normal routine treadmill test.  I saw her in June 2018 at which time she was feeling well.  She was experiencing rare palpitations.  She denied associated chest pain or shortness of breath. She denied significant caffeine intake.  There was no use of pseudoephedrine preparations.  She has been taking Toprol-XL 12.5 mg at bedtime.   I last saw her on June 04, 2019. Over the past several years, she states that she had been doing fairly well and only notes very rare palpitations which lasts seconds.  She developed Covid infection in September with symptoms of fever for 2 to 3 weeks, sore throat, persistent fatigue and weakness.  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  In November 2020 she had lab work and cholesterol was 203 triglycerides 150 LDL cholesterol 124.  She has been taking vitamin D 1000 units.  Vitamin D level was 25.8.  Presently, she denies any chest pain.  She is able to walk without shortness of breath.  Essentially palpitations have completely resolved with the exception of a very rare isolated episode.    Since I last saw her, she subsequently was evaluated by Margart Sickles, PA in 2022.  Coronary CTA in September 2022 showed a calcium score at 1.68 with minimal plaque in the left main and otherwise no other coronary calcification.  She was evaluated  by Edd Fabian in September 2023.  Unfortunately, her husband Mr. Tyrell Brereton who was one of my patients died in 2023/01/05after [place].  Presently, she feels well and denies any episodes of chest pain shortness of breath or palpitations.  She continues to work at [employer] which keeps her occupied particularly since she is now widowed.  She continues to be on metoprolol succinate 25 mg daily and is on Xarelto 20 mg PAF.  She is unaware of breakthrough arrhythmia.   Past Medical History:  Diagnosis Date   Allergy    Atrial fibrillation (HCC)    Colitis    Herniated disc    Herpes zoster 11/07/2011   11/07/2011 right side waistline -T-11 dermatonal distribution >valtrex and  steroid taper     History of headache    Osteoarthritis    PONV (postoperative nausea and vomiting)    Renal calculus    RENAL CALCULUS, HX OF 05/01/2007   Qualifier: Diagnosis of  By: Ronnald Ramp CNA/MA, Jessica     Vertigo     Past Surgical History:  Procedure Laterality Date   BACK SURGERY  2004   COLONOSCOPY     GYNECOLOGIC CRYOSURGERY  1982   SHOULDER SURGERY     VAGINAL HYSTERECTOMY  1991   CIS of the cervix    Allergies  Allergen Reactions   Other     States she needs phenergan with any narcotic pain meds   Propoxyphene Hcl     REACTION:  extreme nausea  Darvon    Current Outpatient Medications  Medication Sig Dispense Refill   acetaminophen (TYLENOL) 500 MG tablet Take 500 mg by mouth every 6 (six) hours as needed.     celecoxib (CELEBREX) 100 MG capsule TAKE 1 CAPSULE(100 MG) BY MOUTH TWICE DAILY 60 capsule 0   meclizine (ANTIVERT) 25 MG tablet Take 1 tablet (25 mg total) by mouth 3 (three) times daily as needed for dizziness. 15 tablet 0   metoprolol succinate (TOPROL-XL) 25 MG 24 hr tablet Take 1 tablet (25 mg total) by mouth daily. 90 tablet 3   rivaroxaban (XARELTO) 20 MG TABS tablet Take 1 tablet (20 mg total) by mouth daily with supper. 90 tablet 3   No current facility-administered medications for this visit.    Social History   Socioeconomic History   Marital status: Widowed    Spouse name: Photographer   Number of children: Not on file   Years of education: Not on file   Highest education level: Not on file  Occupational History    Employer: [employer]  Tobacco Use   Smoking status: Never   Smokeless tobacco: Never  Vaping Use   Vaping Use: Never used  Substance and Sexual Activity   Alcohol use: Yes    Alcohol/week: 3.0 standard drinks of alcohol    Types: 3 Standard drinks or equivalent per week    Comment: weekly   Drug use: No   Sexual activity: Yes    Birth control/protection: Surgical, Implant    Comment: 1st intercourse 67 yo-More than 5 partners  Other Topics Concern   Not on file  Social History Narrative   Not on file   Social Determinants of Health   Financial Resource Strain: Not on file  Food Insecurity: Not on file  Transportation Needs: Not on file  Physical Activity: Not on file  Stress: Not on file  Social Connections: Not on file  Intimate Partner Violence: Not on file   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  She is married for 35 years.  She has 1 daughter age 51 and 2 grandchildren.  Family History  Problem Relation Age of Onset    Atrial fibrillation Mother    Breast cancer Maternal Aunt        mid 7's   Breast cancer Maternal Aunt 35   Colon cancer Neg Hx    Colon polyps Neg Hx    Esophageal cancer Neg Hx    Rectal cancer Neg Hx    Stomach cancer Neg Hx    Family history is notable that mother has a history of atrial fibrillation.  Father has heart disease.  She does not have any siblings.  ROS General: Negative; No fevers,  chills, or night sweats HEENT: Negative; No changes in vision or hearing, sinus congestion, difficulty swallowing Pulmonary: Negative; No cough, wheezing, shortness of breath, hemoptysis Cardiovascular: See HPI:  GI: Negative; No nausea, vomiting, diarrhea, or abdominal pain GU: Negative; No dysuria, hematuria, or difficulty voiding Musculoskeletal: Negative; no myalgias, joint pain, or weakness Hematologic: Negative; no easy bruising, bleeding Endocrine: Vitamin D insufficiency Neuro: Negative; no changes in balance, headaches Skin: Negative; No rashes or skin lesions Psychiatric: Negative; No behavioral problems, depression Sleep: Negative; No snoring,  daytime sleepiness, hypersomnolence, bruxism, restless legs, hypnogognic hallucinations. Other comprehensive 14 point system review is negative   Physical Exam BP 126/82   Pulse 72   Ht 5\' 4"  (1.626 m)   Wt 171 lb 9.6 oz (77.8 kg)   SpO2 97%   BMI 29.46 kg/m    R repeat blood pressure by me was 126/80  Wt Readings from Last 3 Encounters:  04/05/22 171 lb 9.6 oz (77.8 kg)  11/17/21 170 lb (77.1 kg)  10/25/21 173 lb (78.5 kg)   General: Alert, oriented, no distress.  Skin: normal turgor, no rashes, warm and dry HEENT: Normocephalic, atraumatic. Pupils equal round and reactive to light; sclera anicteric; extraocular muscles intact;  Nose without nasal septal hypertrophy Mouth/Parynx benign; Mallinpatti scale 2/3 Neck: No JVD, no carotid bruits; normal carotid upstroke Lungs: clear to ausculatation and percussion; no  wheezing or rales Chest wall: without tenderness to palpitation Heart: PMI not displaced, RRR, s1 s2 normal, 1/6 systolic murmur, no diastolic murmur, no rubs, gallops, thrills, or heaves Abdomen: soft, nontender; no hepatosplenomehaly, BS+; abdominal aorta nontender and not dilated by palpation. Back: no CVA tenderness Pulses 2+ Musculoskeletal: full range of motion, normal strength, no joint deformities Extremities: no clubbing cyanosis or edema, Homan's sign negative  Neurologic: grossly nonfocal; Cranial nerves grossly wnl Psychologic: Normal mood and affect  April 05, 2022 ECG (independently read by me): NSR at 72, , no ectopy, normal intervals  June 04, 2019 ECG (independently read by me): Normal sinus rhythm at 67 bpm.  No ectopy.  Normal intervals.  QTc interval 420 ms.  September 09, 2016 ECG (independently read by me): Normal sinus rhythm at 71 bpm.  No ST segment changes.  Normal intervals with QTc interval 428 ms.  September 2016 ECG (independently read by me): Normal sinus rhythm at 66 bpm.  No significant ST-T changes.  LABS:     Latest Ref Rng & Units 11/17/2021    2:38 PM 09/16/2021    3:01 PM 04/26/2021    5:34 PM  BMP  Glucose 70 - 99 mg/dL 92  04/28/2021  99   BUN 8 - 27 mg/dL 18  21  15    Creatinine 0.57 - 1.00 mg/dL 892   1.19   BUN/Creat Ratio 12 - 28 21  NOT APPLICABLE    Sodium 134 - 144 mmol/L 143  139  138   Potassium 3.5 - 5.2 mmol/L 4.8  4.5  4.2   Chloride 96 - 106 mmol/L 106  102  102   CO2 20 - 29 mmol/L 23  29  26    Calcium 8.7 - 10.3 mg/dL 9.9  4.17  9.7       Latest Ref Rng & Units 01/24/2019   10:11 AM 12/08/2014    9:27 AM 11/21/2013    2:24 PM  Hepatic Function  Total Protein 6.0 - 8.3 g/dL 7.1  6.2  6.8   Albumin 3.5 - 5.2 g/dL 4.3  3.9  4.4   AST 0 - 37 U/L 17  15  21    ALT 0 - 35 U/L 14  14  17    Alk Phosphatase 39 - 117 U/L 103  88  81   Total Bilirubin 0.2 - 1.2 mg/dL 0.4  0.4  0.3       Latest Ref Rng & Units 09/16/2021    3:01 PM  04/26/2021    5:34 PM 04/28/2020    8:20 AM  CBC  WBC 3.8 - 10.8 Thousand/uL 5.6  7.1  4.9   Hemoglobin 11.7 - 15.5 g/dL 13.2  14.2  12.7   Hematocrit 35.0 - 45.0 % 38.6  42.1  37.1   Platelets 140 - 400 Thousand/uL 281  302  259    Lab Results  Component Value Date   MCV 94.6 09/16/2021   MCV 94.6 04/26/2021   MCV 91 04/28/2020    Lab Results  Component Value Date   TSH 1.820 11/10/2020    BNP No results found for: "BNP"  ProBNP No results found for: "PROBNP"   Lipid Panel     Component Value Date/Time   CHOL 203 (H) 01/24/2019 1011   TRIG 150.0 (H) 01/24/2019 1011   HDL 49.30 01/24/2019 1011   CHOLHDL 4 01/24/2019 1011   VLDL 30.0 01/24/2019 1011   LDLCALC 124 (H) 01/24/2019 1011     RADIOLOGY: No results found.  IMPRESSION:  1. PAF (paroxysmal atrial fibrillation) ([place])   2. Anticoagulated   3. Agatston coronary artery calcium score less than 100   4. Diastolic dysfunction     ASSESSMENT AND PLAN: Ms. Nyelli Samara is a 67 year old female who developed an episode of isolated paroxysmal atrial fibrillation in May 2016 which self resolved.  She was advised to take metoprolol tartrate 12.5 mg twice a day, but had only been taking this 12.5 mg at bedtime.  When I saw her in follow-up, I change this to metoprolol succinate for more long acting therapy.  I have not seen her in approximately 3 years but she has had subsequent evaluation with our APP's.  She underwent coronary calcium score on November 18, 2020 which showed minimal coronary calcification with a score of 1.68 with less than 25% calcification in the left main.  Other coronaries had 0 calcification.  She apparently had 2 recurrent short-lived episode of atrial fibrillation in August 2022 and has been on anticoagulation with Xarelto.  Her blood pressure today is stable.  ECG is stable on metoprolol succinate 25 mg daily.  I reviewed her coronary CTA evaluation.  She continues to be followed by Dr. Luetta Nutting  in [place] for primary care.  Particularly since her husband's death, she decided against retiring and keeps herself busy enjoying her work at [employer].  Her weight is stable.  Blood pressure is well-controlled with resting pulse in the 70s.  I will see her in 1 year for reevaluation or sooner as needed.  [name], MD, [facility]  04/09/2022 11:30 AM

## 2022-04-05 NOTE — Patient Instructions (Signed)
    Follow-Up: At [facility], you and your health needs are our priority.  As part of our continuing mission to provide you with exceptional heart care, we have created designated Provider Care Teams.  These Care Teams include your primary Cardiologist (physician) and Advanced Practice Providers (APPs -  Physician Assistants and Nurse Practitioners) who all work together to provide you with the care you need, when you need it.  We recommend signing up for the patient portal called "MyChart".  Sign up information is provided on this After Visit Summary.  MyChart is used to connect with patients for Virtual Visits (Telemedicine).  Patients are able to view lab/test results, encounter notes, upcoming appointments, etc.  Non-urgent messages can be sent to your provider as well.   To learn more about what you can do with MyChart, go to [email or web address].    Your next appointment:   12 month(s)  Provider:   Thomas Kelly, MD     

## 2022-04-09 ENCOUNTER — Encounter: Payer: Self-pay | Admitting: Cardiovascular Disease

## 2022-04-11 LAB — HM MAMMOGRAPHY

## 2022-05-05 ENCOUNTER — Telehealth: Payer: Self-pay | Admitting: Family Medicine

## 2022-05-05 DIAGNOSIS — Z780 Asymptomatic menopausal state: Secondary | ICD-10-CM

## 2022-05-05 NOTE — Telephone Encounter (Signed)
Pt requesting a bone density test. Pt was told by a Dr. Maxie Better to call our office and request one.

## 2022-05-25 ENCOUNTER — Ambulatory Visit (INDEPENDENT_AMBULATORY_CARE_PROVIDER_SITE_OTHER): Payer: No Typology Code available for payment source

## 2022-05-25 DIAGNOSIS — Z780 Asymptomatic menopausal state: Secondary | ICD-10-CM

## 2022-08-15 ENCOUNTER — Encounter: Payer: Self-pay | Admitting: Family Medicine

## 2022-08-15 ENCOUNTER — Ambulatory Visit: Payer: No Typology Code available for payment source | Admitting: Family Medicine

## 2022-08-15 VITALS — BP 127/69 | HR 66 | Ht 62.0 in | Wt 162.5 lb

## 2022-08-15 DIAGNOSIS — H60501 Unspecified acute noninfective otitis externa, right ear: Secondary | ICD-10-CM

## 2022-08-15 DIAGNOSIS — R110 Nausea: Secondary | ICD-10-CM | POA: Diagnosis not present

## 2022-08-15 MED ORDER — SCOPOLAMINE 1 MG/3DAYS TD PT72: 1.0000 | MEDICATED_PATCH | TRANSDERMAL | 0 refills | Status: DC

## 2022-08-15 MED ORDER — CIPROFLOXACIN-DEXAMETHASONE 0.3-0.1 % OT SUSP
4.0000 [drp] | Freq: Two times a day (BID) | OTIC | 0 refills | Status: AC
Start: 2022-08-15 — End: 2022-08-18

## 2022-08-15 NOTE — Assessment & Plan Note (Signed)
-   pt going on a cruise and gets sea sick. Will go ahead and send in scopolamine patch. Discussed that she can change patch every three days.

## 2022-08-15 NOTE — Progress Notes (Signed)
Acute Office Visit  Subjective:     Patient ID: Shirley Carroll, female    DOB: September 13, 1955, 67 y.o.   MRN: [id number]  Chief Complaint  Patient presents with   Ear Pain    Sharp / dull - earache pain x this a.m. - comes and goes.  Patient states has been "crusty"  inside ear canal x 1 weeks =kph    HPI Patient is in today for otalgia of her right ear. She said it started this morning. Denies any other symptoms.   Is going on a cruise and gets sea sick and wants something.   Review of Systems  Constitutional:  Negative for chills and fever.  HENT:  Positive for ear pain.   Respiratory:  Negative for cough and shortness of breath.   Cardiovascular:  Negative for chest pain.  Neurological:  Negative for headaches.        Objective:    BP 127/69   Pulse 66   Ht 5\' 2"  (1.575 m)   Wt 162 lb 8 oz (73.7 kg)   SpO2 99%   BMI 29.72 kg/m    Physical Exam Vitals and nursing note reviewed.  Constitutional:      General: She is not in acute distress.    Appearance: Normal appearance.  HENT:     Head: Normocephalic and atraumatic.     Right Ear: Tympanic membrane and ear canal normal.     Left Ear: Tympanic membrane, ear canal and external ear normal.     Ears:     Comments: Tenderness to palpation of pinna of r ear    Nose: Nose normal.  Eyes:     Conjunctiva/sclera: Conjunctivae normal.  Cardiovascular:     Rate and Rhythm: Normal rate and regular rhythm.  Pulmonary:     Effort: Pulmonary effort is normal.     Breath sounds: Normal breath sounds.  Neurological:     General: No focal deficit present.     Mental Status: She is alert and oriented to person, place, and time.  Psychiatric:        Mood and Affect: Mood normal.        Behavior: Behavior normal.        Thought Content: Thought content normal.        Judgment: Judgment normal.     No results found for any visits on 08/15/22.      Assessment & Plan:   Problem List Items Addressed This Visit        Nervous and Auditory   Acute otitis externa of right ear    - due to tenderness to palpation of the pinna will go ahead and send in ciprodex and treat for otitis externa - discussed with patient that if she is not feeling better in two days we can consider sending in an oral antibiotic.        Relevant Medications   ciprofloxacin-dexamethasone (CIPRODEX) OTIC suspension     Other   Nausea - Primary    - pt going on a cruise and gets sea sick. Will go ahead and send in scopolamine patch. Discussed that she can change patch every three days.       Relevant Medications   scopolamine (TRANSDERM-SCOP) 1 MG/3DAYS    Meds ordered this encounter  Medications   scopolamine (TRANSDERM-SCOP) 1 MG/3DAYS    Sig: Place 1 patch (1.5 mg total) onto the skin every 3 (three) days.    Dispense:  4 patch  Refill:  0   ciprofloxacin-dexamethasone (CIPRODEX) OTIC suspension    Sig: Place 4 drops into the right ear 2 (two) times daily for 3 days.    Dispense:  1.2 mL    Refill:  0    Return if symptoms worsen or fail to improve.  Charlton Amor, DO

## 2022-08-15 NOTE — Assessment & Plan Note (Addendum)
-   due to tenderness to palpation of the pinna will go ahead and send in ciprodex and treat for otitis externa - discussed with patient that if she is not feeling better in two days we can consider sending in an oral antibiotic.

## 2022-12-16 ENCOUNTER — Other Ambulatory Visit: Payer: Self-pay | Admitting: Family Medicine

## 2022-12-16 DIAGNOSIS — Z1212 Encounter for screening for malignant neoplasm of rectum: Secondary | ICD-10-CM

## 2022-12-16 DIAGNOSIS — Z1211 Encounter for screening for malignant neoplasm of colon: Secondary | ICD-10-CM

## 2023-03-22 ENCOUNTER — Ambulatory Visit: Payer: No Typology Code available for payment source | Admitting: Family Medicine

## 2023-03-22 VITALS — BP 114/73 | HR 72 | Ht 62.0 in | Wt 171.0 lb

## 2023-03-22 DIAGNOSIS — M1711 Unilateral primary osteoarthritis, right knee: Secondary | ICD-10-CM

## 2023-03-22 DIAGNOSIS — G629 Polyneuropathy, unspecified: Secondary | ICD-10-CM

## 2023-03-22 MED ORDER — GABAPENTIN 300 MG PO CAPS: ORAL_CAPSULE | ORAL | 3 refills | Status: DC

## 2023-03-22 NOTE — Assessment & Plan Note (Signed)
 Has some neuropathic pain in L shin area. Adding gabapentin back on.

## 2023-03-22 NOTE — Patient Instructions (Addendum)
 Try Over the counter voltaren gel. Try Over the counter glucosamine/chondroitin supplement.

## 2023-03-22 NOTE — Assessment & Plan Note (Signed)
 Unable to utilize NSAIDs due to blood thinner.  Recommend topical voltaren, glucosamine/chondroitin supplementation.  May need referral to orthopedics to discuss injections if worsening.

## 2023-03-22 NOTE — Progress Notes (Signed)
 Shirley Carroll - 68 y.o. female MRN [id number]  Date of birth: 12/30/1955  Subjective Chief Complaint  Patient presents with   Arthritis    HPI Shirley Carroll is a 68 y.o. female here today with complaint of arthritic pain.  Pain is located in bilateral hands and knees.  She has been dealing with this for several months.  She does get some swelling of the joints in her hands and thumbs.  She has used tylenol  which only provides mild relief.  She is unable to utilize oral NSAIDs due to Xarelto  use.  ROS:  A comprehensive ROS was completed and negative except as noted per HPI  Allergies  Allergen Reactions   Other     States she needs phenergan  with any narcotic pain meds   Propoxyphene Hcl     REACTION: extreme nausea  Darvon    Past Medical History:  Diagnosis Date   Allergy    Atrial fibrillation (HCC)    Colitis    Herniated disc    Herpes zoster 11/07/2011   11/07/2011 right side waistline -T-11 dermatonal distribution >valtrex  and steroid taper     History of headache    Osteoarthritis    PONV (postoperative nausea and vomiting)    Renal calculus    RENAL CALCULUS, HX OF 05/01/2007   Qualifier: Diagnosis of  By: Joshua CNA/MA, Jessica     Vertigo     Past Surgical History:  Procedure Laterality Date   BACK SURGERY  2004   COLONOSCOPY     GYNECOLOGIC CRYOSURGERY  1982   SHOULDER SURGERY     VAGINAL HYSTERECTOMY  1991   CIS of the cervix    Social History   Socioeconomic History   Marital status: Widowed    Spouse name: film/video editor   Number of children: Not on file   Years of education: Not on file   Highest education level: 12th grade  Occupational History    Employer: [employer]  Tobacco Use   Smoking status: Never   Smokeless tobacco: Never  Vaping Use   Vaping status: Never Used  Substance and Sexual Activity   Alcohol use: Yes    Alcohol/week: 3.0 standard drinks of alcohol    Types: 3 Standard drinks or equivalent per week    Comment: weekly    Drug use: No   Sexual activity: Yes    Birth control/protection: Surgical, Implant    Comment: 1st intercourse 68 yo-More than 5 partners  Other Topics Concern   Not on file  Social History Narrative   Not on file   Social Drivers of Health   Financial Resource Strain: Low Risk  (03/22/2023)   Overall Financial Resource Strain (CARDIA)    Difficulty of Paying Living Expenses: Not hard at all  Food Insecurity: No Food Insecurity (03/22/2023)   Hunger Vital Sign    Worried About Running Out of Food in the Last Year: Never true    Ran Out of Food in the Last Year: Never true  Transportation Needs: No Transportation Needs (03/22/2023)   PRAPARE - Administrator, Civil Service (Medical): No    Lack of Transportation (Non-Medical): No  Physical Activity: Unknown (03/22/2023)   Exercise Vital Sign    Days of Exercise per Week: 0 days    Minutes of Exercise per Session: Not on file  Stress: No Stress Concern Present (03/22/2023)   [employer] of Occupational Health - Occupational Stress Questionnaire    Feeling of Stress :  Not at all  Social Connections: Socially Isolated (03/22/2023)   Social Connection and Isolation Panel [NHANES]    Frequency of Communication with Friends and Family: Three times a week    Frequency of Social Gatherings with Friends and Family: More than three times a week    Attends Religious Services: Never    Database Administrator or Organizations: No    Attends Banker Meetings: Not on file    Marital Status: Widowed    Family History  Problem Relation Age of Onset   Atrial fibrillation Mother    Breast cancer Maternal Aunt        mid 19's   Breast cancer Maternal Aunt 35   Colon cancer Neg Hx    Colon polyps Neg Hx    Esophageal cancer Neg Hx    Rectal cancer Neg Hx    Stomach cancer Neg Hx     Health Maintenance  Topic Date Due   Hepatitis C Screening  Never done   Zoster Vaccines- Shingrix (1 of 2) Never done   Fecal DNA  (Cologuard)  Never done   Cervical Cancer Screening (Pap smear)  08/19/2017   Pneumonia Vaccine 31+ Years old (1 of 1 - PCV) Never done   DTaP/Tdap/Td (2 - Td or Tdap) 12/12/2021   INFLUENZA VACCINE  06/12/2023 (Originally 10/13/2022)   COVID-19 Vaccine (4 - 2024-25 season) 04/06/2024 (Originally 11/13/2022)   MAMMOGRAM  04/12/2023   DEXA SCAN  Completed   HPV VACCINES  Aged Out   Colonoscopy  Discontinued     ----------------------------------------------------------------------------------------------------------------------------------------------------------------------------------------------------------------- Physical Exam BP 114/73 (BP Location: Left Arm, Patient Position: Sitting, Cuff Size: Normal)   Pulse 72   Ht 5' 2 (1.575 m)   Wt 171 lb (77.6 kg)   SpO2 98%   BMI 31.28 kg/m   Physical Exam Constitutional:      Appearance: Normal appearance.  Musculoskeletal:     Comments: Mild DIP swelling of b/l hands.  No swelling or effusion around wrist or knees.    Neurological:     Mental Status: She is alert.     ------------------------------------------------------------------------------------------------------------------------------------------------------------------------------------------------------------------- Assessment and Plan  Osteoarthritis Unable to utilize NSAIDs due to blood thinner.  Recommend topical voltaren, glucosamine/chondroitin supplementation.  May need referral to orthopedics to discuss injections if worsening.    Polyneuropathy Has some neuropathic pain in L shin area. Adding gabapentin  back on.    Meds ordered this encounter  Medications   gabapentin  (NEURONTIN ) 300 MG capsule    Sig: Take at bedtime x1 week then may increase to TID if needed.    Dispense:  90 capsule    Refill:  3    Return in about 2 months (around 05/20/2023) for OA f/u.    This visit occurred during the SARS-CoV-2 public health emergency.  Safety protocols were  in place, including screening questions prior to the visit, additional usage of staff PPE, and extensive cleaning of exam room while observing appropriate contact time as indicated for disinfecting solutions.

## 2023-04-17 LAB — HM MAMMOGRAPHY

## 2023-04-21 ENCOUNTER — Encounter: Payer: Self-pay | Admitting: Family Medicine

## 2023-04-24 ENCOUNTER — Ambulatory Visit: Payer: No Typology Code available for payment source | Admitting: Cardiovascular Disease

## 2023-04-27 ENCOUNTER — Encounter: Payer: Self-pay | Admitting: Cardiovascular Disease

## 2023-04-27 ENCOUNTER — Ambulatory Visit
Payer: No Typology Code available for payment source | Attending: Cardiovascular Disease | Admitting: Cardiovascular Disease

## 2023-04-27 VITALS — BP 132/76 | HR 69 | Ht 62.0 in | Wt 171.6 lb

## 2023-04-27 DIAGNOSIS — M1711 Unilateral primary osteoarthritis, right knee: Secondary | ICD-10-CM

## 2023-04-27 DIAGNOSIS — Z7901 Long term (current) use of anticoagulants: Secondary | ICD-10-CM | POA: Diagnosis not present

## 2023-04-27 DIAGNOSIS — I480 Paroxysmal atrial fibrillation: Secondary | ICD-10-CM | POA: Diagnosis not present

## 2023-04-27 DIAGNOSIS — R931 Abnormal findings on diagnostic imaging of heart and coronary circulation: Secondary | ICD-10-CM | POA: Diagnosis not present

## 2023-04-27 DIAGNOSIS — M25511 Pain in right shoulder: Secondary | ICD-10-CM

## 2023-04-27 DIAGNOSIS — I5189 Other ill-defined heart diseases: Secondary | ICD-10-CM | POA: Diagnosis not present

## 2023-04-27 LAB — CBC

## 2023-04-27 NOTE — Progress Notes (Signed)
Patient ID: Shirley Carroll, female   DOB: 1955/07/04, 68 y.o.   MRN: [id number]      HPI: Shirley Carroll is a 68 y.o. female who presents for a 13 month follow-up evaluation.  Shirley Carroll presented to the emergency room on 08/10/2014 after a 2 day history of intermittent shortness of breath and chest pressure.  She was found to have atrial fibrillation on ECG  with a controlled ventricular rate at 98 bpm.  Laboratory was unremarkable including TSH, CBC, BMP, troponin and d-dimer.  She is negative.  Chest x-ray.  She was started on low-dose metoprolo  tartrate 12.5 mg twice a day with follow-up recommended.  She has a cha2ds2vasc score of 1 and anticoagulation therapy was not instituted.  She was seen by extenders in June 2016 and requested that I see her in follow-up for cardiology care since I take care of her husband and she presents for evaluation.  She underwent an echo Doppler study on 09/02/2014.  This revealed normal systolic function with grade 2 diastolic dysfunction.  Trivial mitral regurgitation.  There was very minimal increased.  PA pressure 32 mm.  When I saw her in 2016, she denied any he any recurrent episodes of palpitations.  She was walking daily and denied exertional symptomatology.  In January 2017 she had a normal routine treadmill test.  I saw her in June 2018 at which time she was feeling well.  She was experiencing rare palpitations.  She denied associated chest pain or shortness of breath. She denied significant caffeine intake.  There was no use of pseudoephedrine preparations.  She has been taking Toprol-XL 12.5 mg at bedtime.   I saw her on June 04, 2019. Over the past several years, she had been doing fairly well and only notes very rare palpitations which lasts seconds.  She developed Covid infection in September with symptoms of fever for 2 to 3 weeks, sore throat, persistent fatigue and weakness.  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  In November 2020 she had lab work and cholesterol was 203 triglycerides 150 LDL cholesterol 124.  She has been taking vitamin D 1000 units.  Vitamin D level was 25.8.  Presently, she denies any chest pain.  She is able to walk without shortness of breath.  Essentially palpitations have completely resolved with the exception of a very rare isolated episode.    I last saw her on April 05, 2022 after not having seen her in almost 3 years.  During that time she was evaluated by Shirley Sickles, PA in 2022.  Coronary CTA in September 2022 showed a calcium score at 1.68 with minimal plaque in the left main and otherwise no other coronary calcification.  She was evaluated  by Shirley Carroll in September 2023.  Unfortunately, her husband Shirley Carroll who was one of my patients died [place] 29[place]2022[place].  Presently, she feels well and denies any episodes of chest pain shortness of breath or palpitations.  She continues to work at [employer] which keeps her occupied particularly since she is now widowed.  She continues to be on metoprolol succinate 25 mg daily and is on Xarelto 20 mg PAF.  She is unaware of breakthrough arrhythmia.  Since I last saw her, she has continued to do well.  She is unaware of any recurrent atrial fibrillation.  Her mother had a history of A-fib.  She denies  any shortness of breath, presyncope or syncope or palpitations.  She continues to work.  She continues to be on Xarelto 20 mg in addition to metoprolol succinate 25 mg daily.  She had some knee issues in the past and recently had issues in her right shoulder with impingement and was given a prednisone Dosepak.  She takes gabapentin for neuropathy.  She presents for yearly evaluation.   Past Medical History:  Diagnosis Date   Allergy    Atrial fibrillation (HCC)    Colitis    Herniated disc    Herpes zoster 11/07/2011   11/07/2011 right side waistline -T-11 dermatonal distribution  >valtrex and steroid taper     History of headache    Osteoarthritis    PONV (postoperative nausea and vomiting)    Renal calculus    RENAL CALCULUS, HX OF 05/01/2007   Qualifier: Diagnosis of  By: Yetta Barre CNA/MA, Jessica     Vertigo     Past Surgical History:  Procedure Laterality Date   BACK SURGERY  2004   COLONOSCOPY     GYNECOLOGIC CRYOSURGERY  1982   SHOULDER SURGERY     VAGINAL HYSTERECTOMY  1991   CIS of the cervix    Allergies  Allergen Reactions   Other     States she needs phenergan with any narcotic pain meds   Propoxyphene Hcl     REACTION: extreme nausea  Darvon    Current Outpatient Medications  Medication Sig Dispense Refill   acetaminophen (TYLENOL) 500 MG tablet Take 500 mg by mouth every 6 (six) hours as needed.     gabapentin (NEURONTIN) 300 MG capsule Take at bedtime x1 week then may increase to TID if needed. 90 capsule 3   metoprolol succinate (TOPROL-XL) 25 MG 24 hr tablet Take 1 tablet (25 mg total) by mouth daily. 90 tablet 3   rivaroxaban (XARELTO) 20 MG TABS tablet Take 1 tablet (20 mg total) by mouth daily with supper. 90 tablet 3   predniSONE (STERAPRED UNI-PAK 21 TAB) 5 MG (21) TBPK tablet Take 5 mg by mouth. (Patient not taking: Reported on 04/27/2023)     No current facility-administered medications for this visit.    Social History   Socioeconomic History   Marital status: Widowed    Spouse name: Shirley Carroll   Number of children: Not on file   Years of education: Not on file   Highest education level: 12th grade  Occupational History    Employer: [employer]  Tobacco Use   Smoking status: Never   Smokeless tobacco: Never  Vaping Use   Vaping status: Never Used  Substance and Sexual Activity   Alcohol use: Yes    Alcohol/week: 3.0 standard drinks of alcohol    Types: 3 Standard drinks or equivalent per week    Comment: weekly   Drug use: No   Sexual activity: Yes    Birth control/protection: Surgical, Implant    Comment: 1st  intercourse 68 yo-More than 5 partners  Other Topics Concern   Not on file  Social History Narrative   Not on file   Social Drivers of Health   Financial Resource Strain: Low Risk  (03/22/2023)   Overall Financial Resource Strain (CARDIA)    Difficulty of Paying Living Expenses: Not hard at all  Food Insecurity: No Food Insecurity (03/22/2023)   Hunger Vital Sign    Worried About Running Out of Food in the Last Year: Never true    Ran Out of Food in the  Last Year: Never true  Transportation Needs: No Transportation Needs (03/22/2023)   PRAPARE - Administrator, Civil Service (Medical): No    Lack of Transportation (Non-Medical): No  Physical Activity: Unknown (03/22/2023)   Exercise Vital Sign    Days of Exercise per Week: 0 days    Minutes of Exercise per Session: Not on file  Stress: No Stress Concern Present (03/22/2023)   [employer] of Occupational Health - Occupational Stress Questionnaire    Feeling of Stress : Not at all  Social Connections: Socially Isolated (03/22/2023)   Social Connection and Isolation Panel [NHANES]    Frequency of Communication with Friends and Family: Three times a week    Frequency of Social Gatherings with Friends and Family: More than three times a week    Attends Religious Services: Never    Database administrator or Organizations: No    Attends Banker Meetings: Not on file    Marital Status: Widowed  Intimate Partner Violence: Not on [employer] she works as an Environmental health practitioner for [employer].  She was married for over 36 years and is now widowed.  She has 1 daughter age 66 and 2 grandchildren.  Family History  Problem Relation Age of Onset   Atrial fibrillation Mother    Breast cancer Maternal Aunt        mid 14's   Breast cancer Maternal Aunt 35   Colon cancer Neg Hx    Colon polyps Neg Hx    Esophageal cancer Neg Hx    Rectal cancer Neg Hx    Stomach cancer Neg Hx    Family history is  notable that mother has a history of atrial fibrillation.  Father has heart disease.  She does not have any siblings.  ROS General: Negative; No fevers, chills, or night sweats HEENT: Negative; No changes in vision or hearing, sinus congestion, difficulty swallowing Pulmonary: Negative; No cough, wheezing, shortness of breath, hemoptysis Cardiovascular: See HPI:  GI: Negative; No nausea, vomiting, diarrhea, or abdominal pain GU: Negative; No dysuria, hematuria, or difficulty voiding Musculoskeletal: Impingement syndrome of right shoulder Hematologic: Negative; no easy bruising, bleeding Endocrine: Vitamin D insufficiency Neuro: Negative; no changes in balance, headaches Skin: Negative; No rashes or skin lesions Psychiatric: Negative; No behavioral problems, depression Sleep: Negative; No snoring,  daytime sleepiness, hypersomnolence, bruxism, restless legs, hypnogognic hallucinations. Other comprehensive 14 point system review is negative   Physical Exam BP 132/76 (BP Location: Left Arm, Patient Position: Sitting, Cuff Size: Normal)   Pulse 69   Ht 5\' 2"  (1.575 m)   Wt 171 lb 9.6 oz (77.8 kg)   SpO2 94%   BMI 31.39 kg/m    R repeat blood pressure by me was   Wt Readings from Last 3 Encounters:  04/27/23 171 lb 9.6 oz (77.8 kg)  03/22/23 171 lb (77.6 kg)  08/15/22 162 lb 8 oz (73.7 kg)   General: Alert, oriented, no distress.  Skin: normal turgor, no rashes, warm and dry HEENT: Normocephalic, atraumatic. Pupils equal round and reactive to light; sclera anicteric; extraocular muscles intact;  Nose without nasal septal hypertrophy Mouth/Parynx benign; Mallinpatti scale 2/3 Neck: No JVD, no carotid bruits; normal carotid upstroke Lungs: clear to ausculatation and percussion; no wheezing or rales Chest wall: without tenderness to palpitation Heart: PMI not displaced, RRR, s1 s2 normal, 1/6 systolic murmur, no diastolic murmur, no rubs, gallops, thrills, or heaves Abdomen: soft,  nontender; no hepatosplenomehaly, BS+; abdominal aorta  nontender and not dilated by palpation. Back: no CVA tenderness Pulses 2+ Musculoskeletal: full range of motion, normal strength, no joint deformities Extremities: no clubbing cyanosis or edema, Homan's sign negative  Neurologic: grossly nonfocal; Cranial nerves grossly wnl Psychologic: Normal mood and affect   EKG Interpretation Date/Time:  Thursday April 27 2023 08:24:58 EST Ventricular Rate:  69 PR Interval:  142 QRS Duration:  82 QT Interval:  384 QTC Calculation: 411 R Axis:   4  Text Interpretation: Normal sinus rhythm Minimal voltage criteria for LVH, may be normal variant ( R in aVL ) When compared with ECG of 02-Feb-2021 11:53, No significant change was found Confirmed by Shirley Carroll ([id number]) on 04/27/2023 9:30:43 AM    April 05, 2022 ECG (independently read by me): NSR at 72, , no ectopy, normal intervals  June 04, 2019 ECG (independently read by me): Normal sinus rhythm at 67 bpm.  No ectopy.  Normal intervals.  QTc interval 420 ms.  September 09, 2016 ECG (independently read by me): Normal sinus rhythm at 71 bpm.  No ST segment changes.  Normal intervals with QTc interval 428 ms.  September 2016 ECG (independently read by me): Normal sinus rhythm at 66 bpm.  No significant ST-T changes.  LABS:     Latest Ref Rng & Units 04/27/2023    9:34 AM 11/17/2021    2:38 PM 09/16/2021    3:01 PM  BMP  Glucose 70 - 99 mg/dL 604  92  540   BUN 8 - 27 mg/dL 22  18  21    Creatinine 0.57 - 1.00 mg/dL 9.81  1.91  4.78   BUN/Creat Ratio 12 - 28 25  21   NOT APPLICABLE   Sodium 134 - 144 mmol/L 140  143  139   Potassium 3.5 - 5.2 mmol/L 4.9  4.8  4.5   Chloride 96 - 106 mmol/L 104  106  102   CO2 20 - 29 mmol/L 23  23  29    Calcium 8.7 - 10.3 mg/dL 29.5  9.9  62.1       Latest Ref Rng & Units 04/27/2023    9:34 AM 01/24/2019   10:11 AM 12/08/2014    9:27 AM  Hepatic Function  Total Protein 6.0 - 8.5 g/dL 6.8  7.1  6.2    Albumin 3.9 - 4.9 g/dL 4.6  4.3  3.9   AST 0 - 40 IU/L 13  17  15    ALT 0 - 32 IU/L 14  14  14    Alk Phosphatase 44 - 121 IU/L 115  103  88   Total Bilirubin 0.0 - 1.2 mg/dL 0.2  0.4  0.4       Latest Ref Rng & Units 04/27/2023    9:34 AM 09/16/2021    3:01 PM 04/26/2021    5:34 PM  CBC  WBC 3.4 - 10.8 x10E3/uL 9.2  5.6  7.1   Hemoglobin 11.1 - 15.9 g/dL 30.8  65.7  84.6   Hematocrit 34.0 - 46.6 % 37.9  38.6  42.1   Platelets 150 - 450 x10E3/uL 301  281  302    Lab Results  Component Value Date   MCV 94 04/27/2023   MCV 94.6 09/16/2021   MCV 94.6 04/26/2021    Lab Results  Component Value Date   TSH 1.840 04/27/2023    BNP No results found for: "BNP"  ProBNP No results found for: "PROBNP"   Lipid Panel     Component Value  Date/Time   CHOL 201 (H) 04/27/2023 0934   TRIG 101 04/27/2023 0934   HDL 66 04/27/2023 0934   CHOLHDL 3.0 04/27/2023 0934   CHOLHDL 4 01/24/2019 1011   VLDL 30.0 01/24/2019 1011   LDLCALC 117 (H) 04/27/2023 0934     RADIOLOGY: No results found.  IMPRESSION:  1. PAF (paroxysmal atrial fibrillation) (HCC)   2. Anticoagulated   3. Agatston coronary artery calcium score less than 100   4. Diastolic dysfunction   5. Right shoulder pain, unspecified chronicity: Impingement syndrome   6. Primary osteoarthritis of right knee     ASSESSMENT AND PLAN: Ms. Trenita Hulme is a 68 year old female who developed an episode of isolated paroxysmal atrial fibrillation in May 2016 which self resolved.  She was advised to take metoprolol tartrate 12.5 mg twice a day, but had only been taking this 12.5 mg at bedtime.  When I saw her in follow-up, I change this to metoprolol succinate for more long acting therapy.  I have not seen her in approximately 3 years but she has had subsequent evaluation with our APP's.  She underwent coronary calcium score on November 18, 2020 which showed minimal coronary calcification with a score of 1.68 with less than 25%  calcification in the left main.  Other coronaries had 0 calcification.  She apparently had 2 recurrent short-lived episode of atrial fibrillation in August 2022 and has been on anticoagulation with Xarelto.  Presently, she has been on metoprolol succinate 25 mg and is unaware of any breakthrough atrial fibrillation.  She denies any palpitations.  She continues to be anticoagulated on Xarelto 20 mg.  Blood pressure today is upper normal to minimally increased at 136/82 when checked by me but initially was 132/76 upon arrival.  She was recently started on a prednisone taper following due to impingement syndrome of the right shoulder for which she is seeing Shirley Carroll.  She has not had recent laboratory.  I am recommending we obtain a comprehensive metabolic panel, CBC, TSH, lipid panel, and will also check LP(a).  Presently, she is stable.  She continues to work particularly since her husband who was my patient for many years is now deceased and it keeps her occupied.  I discussed with her my plans for retirement later this year.  She continues to be followed by Shirley Carroll in [place].  I will transition her to the care of Dr. Janne Carroll at our [place] office and have recommended a 1 year follow-up evaluation.    Shirley Bihari, MD, [facility]  04/29/2023 10:16 AM

## 2023-04-27 NOTE — Patient Instructions (Addendum)
Medication Instructions:  No medication changes were made during today's visit  *If you need a refill on your cardiac medications before your next appointment, please call your pharmacy*   Lab Work: Fasting labs will be drawn today. If you have labs (blood work) drawn today and your tests are completely normal, you will receive your results only by: MyChart Message (if you have MyChart) OR A paper copy in the mail If you have any lab test that is abnormal or we need to change your treatment, we will call you to review the results.   Testing/Procedures: No procedures ordered today.    Follow-Up: At [facility], you and your health needs are our priority.  As part of our continuing mission to provide you with exceptional heart care, we have created designated Provider Care Teams.  These Care Teams include your primary Cardiologist (physician) and Advanced Practice Providers (APPs -  Physician Assistants and Nurse Practitioners) who all work together to provide you with the care you need, when you need it.  We recommend signing up for the patient portal called "MyChart".  Sign up information is provided on this After Visit Summary.  MyChart is used to connect with patients for Virtual Visits (Telemedicine).  Patients are able to view lab/test results, encounter notes, upcoming appointments, etc.  Non-urgent messages can be sent to your provider as well.   To learn more about what you can do with MyChart, go to [email or web address].    Your next appointment:   1 year(s)  Provider:   Jodelle Red, MD    Other Instructions A letter will be mailed to you as a reminder to call the office for your follow up appointment.

## 2023-04-29 ENCOUNTER — Encounter: Payer: Self-pay | Admitting: Cardiovascular Disease

## 2023-04-29 LAB — COMPREHENSIVE METABOLIC PANEL
ALT: 14 [IU]/L (ref 0–32)
AST: 13 [IU]/L (ref 0–40)
Albumin: 4.6 g/dL (ref 3.9–4.9)
Alkaline Phosphatase: 115 [IU]/L (ref 44–121)
BUN/Creatinine Ratio: 25 (ref 12–28)
BUN: 22 mg/dL (ref 8–27)
Bilirubin Total: 0.2 mg/dL (ref 0.0–1.2)
CO2: 23 mmol/L (ref 20–29)
Calcium: 10.1 mg/dL (ref 8.7–10.3)
Chloride: 104 mmol/L (ref 96–106)
Creatinine, Ser: 0.89 mg/dL (ref 0.57–1.00)
Globulin, Total: 2.2 g/dL (ref 1.5–4.5)
Glucose: 101 mg/dL — ABNORMAL HIGH (ref 70–99)
Potassium: 4.9 mmol/L (ref 3.5–5.2)
Sodium: 140 mmol/L (ref 134–144)
Total Protein: 6.8 g/dL (ref 6.0–8.5)
eGFR: 71 mL/min/{1.73_m2} (ref >59)

## 2023-04-29 LAB — LIPID PANEL
Chol/HDL Ratio: 3 {ratio} (ref 0.0–4.4)
Cholesterol, Total: 201 mg/dL — ABNORMAL HIGH (ref 100–199)
HDL: 66 mg/dL (ref >39)
LDL Chol Calc (NIH): 117 mg/dL — ABNORMAL HIGH (ref 0–99)
Triglycerides: 101 mg/dL (ref 0–149)
VLDL Cholesterol Cal: 18 mg/dL (ref 5–40)

## 2023-04-29 LAB — CBC
Hematocrit: 37.9 % (ref 34.0–46.6)
Hemoglobin: 12.6 g/dL (ref 11.1–15.9)
MCH: 31.2 pg (ref 26.6–33.0)
MCHC: 33.2 g/dL (ref 31.5–35.7)
MCV: 94 fL (ref 79–97)
Platelets: 301 10*3/uL (ref 150–450)
RBC: 4.04 x10E6/uL (ref 3.77–5.28)
RDW: 12.2 % (ref 11.7–15.4)
WBC: 9.2 10*3/uL (ref 3.4–10.8)

## 2023-04-29 LAB — TSH: TSH: 1.84 u[IU]/mL (ref 0.450–4.500)

## 2023-04-29 LAB — LIPOPROTEIN A (LPA): Lipoprotein (a): <8.4 nmol/L (ref <75.0)

## 2023-05-11 ENCOUNTER — Ambulatory Visit: Payer: Self-pay | Admitting: Family Medicine

## 2023-05-11 NOTE — Telephone Encounter (Signed)
 Copied from CRM [id number]. Topic: Clinical - Medical Advice >> May 11, 2023  9:20 AM Turkey B wrote: Reason for CRM: pt called in has chest congestion, no appt until  March 19, wants to know what she can do to help with congestion  Chief Complaint: Productive cough Symptoms: Nasal drainage, chest congestion, sore throat Frequency: Yesterday Pertinent Negatives: Patient denies difficulty breathing, chest pain Disposition: [] ED /[] Urgent Care (no appt availability in office) / [] Appointment(In office/virtual)/ []  [facility]/ [x] Home Care/ [] Refused Recommended Disposition /[] [facility]/ []  Follow-up with PCP Additional Notes: Patient called in to report a productive cough and additional cold symptoms. Patient is also experiencing nasal drainage, sore throat and chest congestion. Patient stated she had a low grade fever and aches yesterday, but stated both symptoms are gone today. Patient denied coughing spells. Patient denied difficulty breathing and chest pain. Patient has been taking Zyrtec, Robitussin and Tylenol at home for relief. This RN advised home care at this time. This RN advised patient to call back if symptoms worsen. Patient complied.   Reason for Disposition  Cough with cold symptoms (e.g., runny nose, postnasal drip, throat clearing)  Answer Assessment - Initial Assessment Questions 1. ONSET: "When did the cough begin?"      Yesterday 2. SEVERITY: "How bad is the cough today?"      States she has been able to cough up drainage, denies coughing spells 3. SPUTUM: "Describe the color of your sputum" (none, dry cough; clear, white, yellow, green)     Yellow/green 4. HEMOPTYSIS: "Are you coughing up any blood?" If so ask: "How much?" (flecks, streaks, tablespoons, etc.)     Denies 5. DIFFICULTY BREATHING: "Are you having difficulty breathing?" If Yes, ask: "How bad is it?" (e.g., mild, moderate, severe)    - MILD: No SOB at rest, mild SOB with walking,  speaks normally in sentences, can lie down, no retractions, pulse < 100.    - MODERATE: SOB at rest, SOB with minimal exertion and prefers to sit, cannot lie down flat, speaks in phrases, mild retractions, audible wheezing, pulse 100-120.    - SEVERE: Very SOB at rest, speaks in single words, struggling to breathe, sitting hunched forward, retractions, pulse > 120      Denies 6. FEVER: "Do you have a fever?" If Yes, ask: "What is your temperature, how was it measured, and when did it start?"     Denies fever at this time, 99.8 went she went to bed last night 7. CARDIAC HISTORY: "Do you have any history of heart disease?" (e.g., heart attack, congestive heart failure)      Afib 8. LUNG HISTORY: "Do you have any history of lung disease?"  (e.g., pulmonary embolus, asthma, emphysema)     Denies 10. OTHER SYMPTOMS: "Do you have any other symptoms?" (e.g., runny nose, wheezing, chest pain)       Nasal drainage, chest congestion, sore throat, body aches, denies chest pain  Protocols used: Cough - Acute Productive-A-AH

## 2023-05-13 ENCOUNTER — Other Ambulatory Visit: Payer: Self-pay

## 2023-05-13 ENCOUNTER — Ambulatory Visit
Admission: RE | Admit: 2023-05-13 | Discharge: 2023-05-13 | Disposition: A | Discharge status: Home / Self Care | Source: Ambulatory Visit | Attending: Family Medicine

## 2023-05-13 VITALS — BP 100/66 | HR 84 | Temp 98.0°F | Resp 18

## 2023-05-13 DIAGNOSIS — I480 Paroxysmal atrial fibrillation: Secondary | ICD-10-CM

## 2023-05-13 DIAGNOSIS — J209 Acute bronchitis, unspecified: Secondary | ICD-10-CM | POA: Diagnosis not present

## 2023-05-13 MED ORDER — PREDNISONE 10 MG PO TABS: 30.0000 mg | ORAL_TABLET | Freq: Every day | ORAL | 0 refills | Status: DC

## 2023-05-13 MED ORDER — PROMETHAZINE-DM 6.25-15 MG/5ML PO SYRP: 5.0000 mL | ORAL_SOLUTION | Freq: Three times a day (TID) | ORAL | 0 refills | Status: AC | PRN

## 2023-05-13 NOTE — ED Triage Notes (Signed)
 C/O cough and chest congestion onset 3 days ago along with fatigue. States called PCP office 2 days ago and was told to continue Zyrtec, Robitussin, and Tyl prn. States feels like she is not improving, and now c/o chest burning with cough. Reports temps up to 99.8.

## 2023-05-13 NOTE — ED Provider Notes (Signed)
 [facility]  Note:  This document was prepared using Conservation officer, historic buildings and may include unintentional dictation errors.  MRN: [id number] DOB: Mar 24, 1955  Subjective:   Shirley Carroll is a 68 y.o. female presenting for 3-day history of productive cough, chest congestion, fatigue and malaise.  Called her PCP and was advised to use supportive care.  Has burning chest sensation with her cough.  Highest temperature was 99.8 F.  No asthma.  Has atrial fibrillation which is well-controlled.  Reports that she is typically done well with steroids.  No smoking of any kind including cigarettes, cigars, vaping, marijuana use.    No current facility-administered medications for this encounter.  Current Outpatient Medications:    acetaminophen (TYLENOL) 500 MG tablet, Take 500 mg by mouth every 6 (six) hours as needed., Disp: , Rfl:    Cetirizine HCl (ZYRTEC PO), Take by mouth., Disp: , Rfl:    gabapentin (NEURONTIN) 300 MG capsule, Take at bedtime x1 week then may increase to TID if needed., Disp: 90 capsule, Rfl: 3   metoprolol succinate (TOPROL-XL) 25 MG 24 hr tablet, Take 1 tablet (25 mg total) by mouth daily., Disp: 90 tablet, Rfl: 3   rivaroxaban (XARELTO) 20 MG TABS tablet, Take 1 tablet (20 mg total) by mouth daily with supper., Disp: 90 tablet, Rfl: 3   predniSONE (STERAPRED UNI-PAK 21 TAB) 5 MG (21) TBPK tablet, Take 5 mg by mouth. (Patient not taking: Reported on 04/27/2023), Disp: , Rfl:    Allergies  Allergen Reactions   Other     States she needs phenergan with any narcotic pain meds   Propoxyphene Hcl     REACTION: extreme nausea  Darvon    Past Medical History:  Diagnosis Date   Allergy    Atrial fibrillation (HCC)    Colitis    Herniated disc    Herpes zoster 11/07/2011   11/07/2011 right side waistline -T-11 dermatonal distribution >valtrex and steroid taper     History of headache    Osteoarthritis    PONV (postoperative nausea and  vomiting)    Renal calculus    RENAL CALCULUS, HX OF 05/01/2007   Qualifier: Diagnosis of  By: Yetta Barre CNA/MA, Jessica     Vertigo      Past Surgical History:  Procedure Laterality Date   BACK SURGERY  2004   COLONOSCOPY     GYNECOLOGIC CRYOSURGERY  1982   SHOULDER SURGERY     VAGINAL HYSTERECTOMY  1991   CIS of the cervix    Family History  Problem Relation Age of Onset   Atrial fibrillation Mother    Breast cancer Maternal Aunt        mid 48's   Breast cancer Maternal Aunt 35   Colon cancer Neg Hx    Colon polyps Neg Hx    Esophageal cancer Neg Hx    Rectal cancer Neg Hx    Stomach cancer Neg Hx     Social History   Tobacco Use   Smoking status: Never   Smokeless tobacco: Never  Vaping Use   Vaping status: Never Used  Substance Use Topics   Alcohol use: Not Currently    Alcohol/week: 2.0 standard drinks of alcohol    Types: 2 Glasses of wine per week    Comment: weekly   Drug use: No    ROS   Objective:   Vitals: BP 100/66   Pulse 84   Temp 98 F (36.7 C) (Oral)  Resp 18   SpO2 94%   Physical Exam Constitutional:      General: She is not in acute distress.    Appearance: Normal appearance. She is well-developed and normal weight. She is not ill-appearing, toxic-appearing or diaphoretic.  HENT:     Head: Normocephalic and atraumatic.     Right Ear: Tympanic membrane, ear canal and external ear normal. No drainage or tenderness. No middle ear effusion. There is no impacted cerumen. Tympanic membrane is not erythematous or bulging.     Left Ear: Tympanic membrane, ear canal and external ear normal. No drainage or tenderness.  No middle ear effusion. There is no impacted cerumen. Tympanic membrane is not erythematous or bulging.     Nose: Nose normal. No congestion or rhinorrhea.     Mouth/Throat:     Mouth: Mucous membranes are moist. No oral lesions.     Pharynx: No pharyngeal swelling, oropharyngeal exudate, posterior oropharyngeal erythema or uvula  swelling.     Tonsils: No tonsillar exudate or tonsillar abscesses.  Eyes:     General: No scleral icterus.       Right eye: No discharge.        Left eye: No discharge.     Extraocular Movements: Extraocular movements intact.     Right eye: Normal extraocular motion.     Left eye: Normal extraocular motion.     Conjunctiva/sclera: Conjunctivae normal.  Cardiovascular:     Rate and Rhythm: Normal rate and regular rhythm.     Heart sounds: Normal heart sounds. No murmur heard.    No friction rub. No gallop.  Pulmonary:     Effort: Pulmonary effort is normal. No respiratory distress.     Breath sounds: No stridor. Rhonchi (trace over mid lung fields bilaterally) present. No wheezing or rales.  Chest:     Chest wall: No tenderness.  Musculoskeletal:     Cervical back: Normal range of motion and neck supple.  Lymphadenopathy:     Cervical: No cervical adenopathy.  Skin:    General: Skin is warm and dry.  Neurological:     General: No focal deficit present.     Mental Status: She is alert and oriented to person, place, and time.  Psychiatric:        Mood and Affect: Mood normal.        Behavior: Behavior normal.     Assessment and Plan :   PDMP not reviewed this encounter.  1. Acute bronchitis, unspecified organism   2. PAF (paroxysmal atrial fibrillation) (HCC)    Offered imaging but patient politely declined.  Will use supportive care and prednisone at a dose of 30 mg for 5 days.  Monitor for atrial fibrillation.  Counseled patient on potential for adverse effects with medications prescribed/recommended today, ER and return-to-clinic precautions discussed, patient verbalized understanding.    Wallis Bamberg, [place] 05/14/23 [phone]

## 2023-05-13 NOTE — Discharge Instructions (Addendum)
 We will manage this as a viral bronchitis. For sore throat or cough try using a honey-based tea. Use 3 teaspoons of honey with juice squeezed from half lemon. Place shaved pieces of ginger into 1/2-1 cup of water and warm over stove top. Then mix the ingredients and repeat every 4 hours as needed. Please take Tylenol 500mg -650mg  once every 6 hours for fevers, aches and pains. Hydrate very well with at least 2 liters (64 ounces) of water. Eat light meals such as soups (chicken and noodles, chicken wild rice, vegetable).  Do not eat any foods that you are allergic to.  Start an antihistamine like Zyrtec (10mg  daily) for postnasal drainage, sinus congestion.  You can take this together with prednisone and cough syrup.

## 2023-05-19 ENCOUNTER — Ambulatory Visit: Payer: No Typology Code available for payment source | Admitting: Family Medicine

## 2023-05-19 ENCOUNTER — Encounter: Payer: Self-pay | Admitting: Family Medicine

## 2023-05-19 VITALS — BP 95/61 | HR 75 | Ht 62.0 in | Wt 172.0 lb

## 2023-05-19 DIAGNOSIS — J40 Bronchitis, not specified as acute or chronic: Secondary | ICD-10-CM

## 2023-05-19 DIAGNOSIS — M1711 Unilateral primary osteoarthritis, right knee: Secondary | ICD-10-CM

## 2023-05-19 DIAGNOSIS — Z1211 Encounter for screening for malignant neoplasm of colon: Secondary | ICD-10-CM | POA: Diagnosis not present

## 2023-05-19 DIAGNOSIS — J329 Chronic sinusitis, unspecified: Secondary | ICD-10-CM | POA: Diagnosis not present

## 2023-05-19 MED ORDER — DOXYCYCLINE HYCLATE 100 MG PO TABS: 100.0000 mg | ORAL_TABLET | Freq: Two times a day (BID) | ORAL | 0 refills | Status: AC

## 2023-05-19 NOTE — Assessment & Plan Note (Signed)
 Continue with supportive care.  Since not improving will add on doxycycline as well. Contact clinic if symptoms continue to worsen.

## 2023-05-19 NOTE — Progress Notes (Signed)
 Shirley Carroll - 68 y.o. female MRN [id number]  Date of birth: [birth year]  Subjective Chief Complaint  Patient presents with   Medical Management of Chronic Issues   URI    HPI Shirley Carroll is a 68 y.o. female here today for follow up.   She reports that OA is improved.  Using voltaren gel occasionally.  Gabapentin seems to help some as well.  The steroid that she was on for bronchitis helped tremendously but she knows that she can't stay on that long term.   She was recently seen at urgent care for productive cough, chest congestion and fatigue.  Prednisone and supportive care recommended.  She has had continued worsening of this and sputum is more thick and yellow.  Denies dyspnea or wheezing.    ROS:  A comprehensive ROS was completed and negative except as noted per HPI  Allergies  Allergen Reactions   Other     States she needs phenergan with any narcotic pain meds   Propoxyphene Hcl     REACTION: extreme nausea  Darvon    Past Medical History:  Diagnosis Date   Allergy    Atrial fibrillation (HCC)    Colitis    Herniated disc    Herpes zoster 11/07/2011   11/07/2011 right side waistline -T-11 dermatonal distribution >valtrex and steroid taper     History of headache    Osteoarthritis    PONV (postoperative nausea and vomiting)    Renal calculus    RENAL CALCULUS, HX OF 05/01/2007   Qualifier: Diagnosis of  By: Yetta Barre CNA/MA, Jessica     Vertigo     Past Surgical History:  Procedure Laterality Date   BACK SURGERY  2004   COLONOSCOPY     GYNECOLOGIC CRYOSURGERY  1982   SHOULDER SURGERY     VAGINAL HYSTERECTOMY  1991   CIS of the cervix    Social History   Socioeconomic History   Marital status: Widowed    Spouse name: Film/video editor   Number of children: Not on file   Years of education: Not on file   Highest education level: 12th grade  Occupational History    Employer: [employer]  Tobacco Use   Smoking status: Never   Smokeless tobacco: Never   Vaping Use   Vaping status: Never Used  Substance and Sexual Activity   Alcohol use: Not Currently    Alcohol/week: 2.0 standard drinks of alcohol    Types: 2 Glasses of wine per week    Comment: weekly   Drug use: No   Sexual activity: Not on file    Comment: 1st intercourse 69 yo-More than 5 partners  Other Topics Concern   Not on file  Social History Narrative   Not on file   Social Drivers of Health   Financial Resource Strain: Low Risk  (03/22/2023)   Overall Financial Resource Strain (CARDIA)    Difficulty of Paying Living Expenses: Not hard at all  Food Insecurity: No Food Insecurity (03/22/2023)   Hunger Vital Sign    Worried About Running Out of Food in the Last Year: Never true    Ran Out of Food in the Last Year: Never true  Transportation Needs: No Transportation Needs (03/22/2023)   PRAPARE - Administrator, Civil Service (Medical): No    Lack of Transportation (Non-Medical): No  Physical Activity: Unknown (03/22/2023)   Exercise Vital Sign    Days of Exercise per Week: 0 days  Minutes of Exercise per Session: Not on file  Stress: No Stress Concern Present (03/22/2023)   [employer] of Occupational Health - Occupational Stress Questionnaire    Feeling of Stress : Not at all  Social Connections: Socially Isolated (03/22/2023)   Social Connection and Isolation Panel [NHANES]    Frequency of Communication with Friends and Family: Three times a week    Frequency of Social Gatherings with Friends and Family: More than three times a week    Attends Religious Services: Never    Database administrator or Organizations: No    Attends Banker Meetings: Not on file    Marital Status: Widowed    Family History  Problem Relation Age of Onset   Atrial fibrillation Mother    Breast cancer Maternal Aunt        mid 62's   Breast cancer Maternal Aunt 35   Colon cancer Neg Hx    Colon polyps Neg Hx    Esophageal cancer Neg Hx    Rectal cancer Neg  Hx    Stomach cancer Neg Hx     Health Maintenance  Topic Date Due   Hepatitis C Screening  Never done   Fecal DNA (Cologuard)  Never done   Zoster Vaccines- Shingrix (1 of 2) Never done   DTaP/Tdap/Td (2 - Td or Tdap) 12/12/2021   INFLUENZA VACCINE  06/12/2023 (Originally 10/13/2022)   COVID-19 Vaccine (4 - 2024-25 season) 04/06/2024 (Originally 11/13/2022)   Pneumonia Vaccine 51+ Years old (1 of 1 - PCV) 05/18/2024 (Originally 08/30/2019)   MAMMOGRAM  04/16/2024   DEXA SCAN  Completed   HPV VACCINES  Aged Out   Colonoscopy  Discontinued     ----------------------------------------------------------------------------------------------------------------------------------------------------------------------------------------------------------------- Physical Exam BP 95/61 (BP Location: Left Arm, Patient Position: Sitting, Cuff Size: Large)   Pulse 75   Ht 5\' 2"  (1.575 m)   Wt 172 lb (78 kg)   SpO2 97%   BMI 31.46 kg/m   Physical Exam Constitutional:      Appearance: Normal appearance.  HENT:     Head: Normocephalic and atraumatic.  Eyes:     General: No scleral icterus. Cardiovascular:     Rate and Rhythm: Normal rate and regular rhythm.  Pulmonary:     Effort: Pulmonary effort is normal.     Breath sounds: Normal breath sounds.  Musculoskeletal:     Cervical back: Neck supple.  Neurological:     Mental Status: She is alert.  Psychiatric:        Mood and Affect: Mood normal.        Behavior: Behavior normal.     ------------------------------------------------------------------------------------------------------------------------------------------------------------------------------------------------------------------- Assessment and Plan  Sinobronchitis Continue with supportive care.  Since not improving will add on doxycycline as well. Contact clinic if symptoms continue to worsen.   Osteoarthritis Better at this time.  She may continue voltaren gel as needed  as well as gabapentin   Meds ordered this encounter  Medications   doxycycline (VIBRA-TABS) 100 MG tablet    Sig: Take 1 tablet (100 mg total) by mouth 2 (two) times daily.    Dispense:  20 tablet    Refill:  0    No follow-ups on file.    This visit occurred during the SARS-CoV-2 public health emergency.  Safety protocols were in place, including screening questions prior to the visit, additional usage of staff PPE, and extensive cleaning of exam room while observing appropriate contact time as indicated for disinfecting solutions.

## 2023-05-19 NOTE — Assessment & Plan Note (Signed)
 Better at this time.  She may continue voltaren gel as needed as well as gabapentin

## 2023-05-27 ENCOUNTER — Other Ambulatory Visit: Payer: Self-pay | Admitting: Cardiovascular Disease

## 2023-06-03 ENCOUNTER — Other Ambulatory Visit: Payer: Self-pay | Admitting: Cardiovascular Disease

## 2023-06-05 NOTE — Telephone Encounter (Signed)
 Prescription refill request for Xarelto received.  Indication: afib  Last office visit:Kelly 04/27/2023 Weight: 78 kg  Age: 68 yo  Scr: 0.89, 04/27/2023 CrCl: 74 ml/min   Refill sent.

## 2023-06-06 ENCOUNTER — Telehealth: Payer: Self-pay | Admitting: Cardiovascular Disease

## 2023-06-06 NOTE — Telephone Encounter (Signed)
 Xarelto 20mg  refill sent on 06/05/23 90 day supply with refill to same pharmacy. Receipt received and called to confirm and he states she picked up her last one that was a 30 day supply.  He states they have not received the 90 day supply. Gave a verbal for 90 tabs and 1 refill per electronic refill transaction on yesterday and he confirmed and accepted.  Dx-afib  Last OV-Kelly 04/27/2023 Weight- 78 kg  Age-68 yo  Scr- 0.89 on 04/27/2023 CrCl- 74.49 ml/min

## 2023-06-06 NOTE — Telephone Encounter (Signed)
*  STAT* If patient is at the pharmacy, call can be transferred to refill team.   1. Which medications need to be refilled? (please list name of each medication and dose if known) rivaroxaban (XARELTO) 20 MG TABS tablet    2. Would you like to learn more about the convenience, safety, & potential cost savings by using the [facility]?      3. Are you open to using the [facility] (Type [facility]. ).   4. Which pharmacy/location (including street and city if local pharmacy) is medication to be sent to? [id number] - [place], [place] - [address]    5. Do they need a 30 day or 90 day supply? 90

## 2023-06-07 LAB — COLOGUARD: COLOGUARD: NEGATIVE

## 2023-06-08 ENCOUNTER — Encounter: Payer: Self-pay | Admitting: Family Medicine

## 2024-01-04 ENCOUNTER — Telehealth: Payer: Self-pay | Admitting: Cardiology

## 2024-01-04 ENCOUNTER — Other Ambulatory Visit: Payer: Self-pay

## 2024-01-04 MED ORDER — RIVAROXABAN 20 MG PO TABS
20.0000 mg | ORAL_TABLET | Freq: Every day | ORAL | 1 refills | Status: AC
Start: 2024-01-04 — End: ?

## 2024-01-04 NOTE — Telephone Encounter (Signed)
 Prescription refill request for Xarelto  received.  Indication:afib Last office visit:2/25 Weight:78  kg Age:68 Scr:0.89 2/25 CrCl:73.46  ml/min  Prescription refilled

## 2024-01-04 NOTE — Telephone Encounter (Signed)
*  STAT* If patient is at the pharmacy, call can be transferred to refill team.   1. Which medications need to be refilled? (please list name of each medication and dose if known) rivaroxaban  (XARELTO ) 20 MG TABS tablet  2. Which pharmacy/location (including street and city if local pharmacy) is medication to be sent to? [id number] - [place], [place] - [address]    3. Do they need a 30 day or 90 day supply?  90 day supply  Patient says she is completely out of medication.

## 2024-04-08 ENCOUNTER — Other Ambulatory Visit: Payer: Self-pay | Admitting: Family Medicine

## 2024-04-08 NOTE — Telephone Encounter (Signed)
 Appointment scheduled 04/16/2024 at 4:10pm with Dr. Alvia.

## 2024-04-08 NOTE — Telephone Encounter (Signed)
 Please see previous note

## 2024-04-08 NOTE — Telephone Encounter (Signed)
 Copied from CRM [id number]. Topic: Clinical - Medication Refill >> Apr 08, 2024 11:29 AM Aleatha C wrote: Medication:  gabapentin  (NEURONTIN ) 300 MG capsule   Has the patient contacted their pharmacy? no (Agent: If no, request that the patient contact the pharmacy for the refill. If patient does not wish to contact the pharmacy document the reason why and proceed with request.) (Agent: If yes, when and what did the pharmacy advise?)  This is the patient's preferred pharmacy:  [id number] [place], [place] - [address] [place] [address] Phone: [phone] Fax: [phone]    Is this the correct pharmacy for this prescription? Yes If no, delete pharmacy and type the correct one.   Has the prescription been filled recently? No  Is the patient out of the medication? Yes  Has the patient been seen for an appointment in the last year OR does the patient have an upcoming appointment? Yes  Can we respond through MyChart? No  Agent: Please be advised that Rx refills may take up to 3 business days. We ask that you follow-up with your pharmacy.

## 2024-04-09 MED ORDER — GABAPENTIN 300 MG PO CAPS: ORAL_CAPSULE | ORAL | 0 refills | Status: AC

## 2024-04-16 ENCOUNTER — Ambulatory Visit: Admitting: Family Medicine

## 2024-04-24 ENCOUNTER — Ambulatory Visit: Admitting: Family Medicine

## 2024-05-02 ENCOUNTER — Ambulatory Visit (HOSPITAL_BASED_OUTPATIENT_CLINIC_OR_DEPARTMENT_OTHER): Admitting: Cardiology
